# Patient Record
Sex: Female | Born: 1945 | Race: White | Hispanic: Yes | Marital: Married | Smoking: Light tobacco smoker
Health system: Southern US, Community
[De-identification: ages and names within clinical notes are randomized; demographics above are authoritative.]

## PROBLEM LIST (undated history)

## (undated) DIAGNOSIS — D229 Melanocytic nevi, unspecified: Secondary | ICD-10-CM

## (undated) DIAGNOSIS — D039 Melanoma in situ, unspecified: Secondary | ICD-10-CM

## (undated) DIAGNOSIS — I471 Supraventricular tachycardia, unspecified: Secondary | ICD-10-CM

## (undated) DIAGNOSIS — M1612 Unilateral primary osteoarthritis, left hip: Secondary | ICD-10-CM

## (undated) DIAGNOSIS — D099 Carcinoma in situ, unspecified: Secondary | ICD-10-CM

## (undated) HISTORY — PX: INCISION AND DRAINAGE BREAST ABSCESS: SUR672

## (undated) HISTORY — PX: TONSILLECTOMY: SUR1361

## (undated) HISTORY — PX: ABDOMINAL HYSTERECTOMY: SHX81

---
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
## 2014-03-21 DIAGNOSIS — D485 Neoplasm of uncertain behavior of skin: Secondary | ICD-10-CM | POA: Diagnosis not present

## 2014-03-21 DIAGNOSIS — L400 Psoriasis vulgaris: Secondary | ICD-10-CM | POA: Diagnosis not present

## 2014-03-21 DIAGNOSIS — D225 Melanocytic nevi of trunk: Secondary | ICD-10-CM | POA: Diagnosis not present

## 2014-03-21 DIAGNOSIS — L820 Inflamed seborrheic keratosis: Secondary | ICD-10-CM | POA: Diagnosis not present

## 2014-03-21 DIAGNOSIS — Z85820 Personal history of malignant melanoma of skin: Secondary | ICD-10-CM | POA: Diagnosis not present

## 2014-03-21 DIAGNOSIS — L409 Psoriasis, unspecified: Secondary | ICD-10-CM | POA: Diagnosis not present

## 2014-04-19 DIAGNOSIS — D485 Neoplasm of uncertain behavior of skin: Secondary | ICD-10-CM | POA: Diagnosis not present

## 2014-04-19 DIAGNOSIS — L820 Inflamed seborrheic keratosis: Secondary | ICD-10-CM | POA: Diagnosis not present

## 2014-04-19 DIAGNOSIS — D229 Melanocytic nevi, unspecified: Secondary | ICD-10-CM

## 2014-04-19 DIAGNOSIS — D099 Carcinoma in situ, unspecified: Secondary | ICD-10-CM

## 2014-04-19 DIAGNOSIS — L905 Scar conditions and fibrosis of skin: Secondary | ICD-10-CM | POA: Diagnosis not present

## 2014-04-19 DIAGNOSIS — D225 Melanocytic nevi of trunk: Secondary | ICD-10-CM | POA: Diagnosis not present

## 2014-04-19 DIAGNOSIS — L409 Psoriasis, unspecified: Secondary | ICD-10-CM | POA: Diagnosis not present

## 2014-04-19 DIAGNOSIS — C44721 Squamous cell carcinoma of skin of unspecified lower limb, including hip: Secondary | ICD-10-CM | POA: Diagnosis not present

## 2014-04-19 HISTORY — DX: Carcinoma in situ, unspecified: D09.9

## 2014-04-19 HISTORY — DX: Melanocytic nevi, unspecified: D22.9

## 2014-05-30 DIAGNOSIS — L570 Actinic keratosis: Secondary | ICD-10-CM | POA: Diagnosis not present

## 2014-05-30 DIAGNOSIS — D485 Neoplasm of uncertain behavior of skin: Secondary | ICD-10-CM | POA: Diagnosis not present

## 2014-05-30 DIAGNOSIS — L409 Psoriasis, unspecified: Secondary | ICD-10-CM | POA: Diagnosis not present

## 2014-05-30 DIAGNOSIS — C44721 Squamous cell carcinoma of skin of unspecified lower limb, including hip: Secondary | ICD-10-CM | POA: Diagnosis not present

## 2014-06-12 DIAGNOSIS — L400 Psoriasis vulgaris: Secondary | ICD-10-CM | POA: Diagnosis not present

## 2014-07-01 DIAGNOSIS — E784 Other hyperlipidemia: Secondary | ICD-10-CM | POA: Diagnosis not present

## 2014-07-01 DIAGNOSIS — I10 Essential (primary) hypertension: Secondary | ICD-10-CM | POA: Diagnosis not present

## 2014-07-04 DIAGNOSIS — I10 Essential (primary) hypertension: Secondary | ICD-10-CM | POA: Diagnosis not present

## 2014-07-04 DIAGNOSIS — E785 Hyperlipidemia, unspecified: Secondary | ICD-10-CM | POA: Diagnosis not present

## 2014-08-06 DIAGNOSIS — L409 Psoriasis, unspecified: Secondary | ICD-10-CM | POA: Diagnosis not present

## 2014-08-22 DIAGNOSIS — L720 Epidermal cyst: Secondary | ICD-10-CM | POA: Diagnosis not present

## 2014-08-23 DIAGNOSIS — L720 Epidermal cyst: Secondary | ICD-10-CM | POA: Diagnosis not present

## 2014-09-03 DIAGNOSIS — Z4802 Encounter for removal of sutures: Secondary | ICD-10-CM | POA: Diagnosis not present

## 2014-11-12 ENCOUNTER — Other Ambulatory Visit (HOSPITAL_COMMUNITY): Payer: Self-pay | Admitting: *Deleted

## 2014-11-13 ENCOUNTER — Encounter (HOSPITAL_COMMUNITY)
Admission: RE | Admit: 2014-11-13 | Discharge: 2014-11-13 | Disposition: A | Discharge status: Home / Self Care | Payer: Medicare Other | Source: Ambulatory Visit | Attending: Dermatology | Admitting: Dermatology

## 2014-11-13 DIAGNOSIS — L4050 Arthropathic psoriasis, unspecified: Secondary | ICD-10-CM | POA: Insufficient documentation

## 2014-11-13 DIAGNOSIS — L400 Psoriasis vulgaris: Secondary | ICD-10-CM | POA: Diagnosis not present

## 2014-11-13 MED ORDER — USTEKINUMAB 45 MG/0.5ML ~~LOC~~ SOSY
45.0000 mg | PREFILLED_SYRINGE | SUBCUTANEOUS | Status: DC
  Administered 2014-11-13: 45 mg via SUBCUTANEOUS
  Filled 2014-11-13: qty 0.5

## 2014-11-15 DIAGNOSIS — E785 Hyperlipidemia, unspecified: Secondary | ICD-10-CM | POA: Diagnosis not present

## 2014-11-15 DIAGNOSIS — I10 Essential (primary) hypertension: Secondary | ICD-10-CM | POA: Diagnosis not present

## 2014-11-19 DIAGNOSIS — E785 Hyperlipidemia, unspecified: Secondary | ICD-10-CM | POA: Diagnosis not present

## 2014-11-19 DIAGNOSIS — I10 Essential (primary) hypertension: Secondary | ICD-10-CM | POA: Diagnosis not present

## 2014-12-11 ENCOUNTER — Encounter (HOSPITAL_COMMUNITY)
Admission: RE | Admit: 2014-12-11 | Discharge: 2014-12-11 | Disposition: A | Discharge status: Home / Self Care | Payer: Medicare Other | Source: Ambulatory Visit | Attending: Dermatology | Admitting: Dermatology

## 2014-12-11 DIAGNOSIS — L4050 Arthropathic psoriasis, unspecified: Secondary | ICD-10-CM | POA: Insufficient documentation

## 2014-12-11 MED ORDER — USTEKINUMAB 45 MG/0.5ML ~~LOC~~ SOSY
45.0000 mg | PREFILLED_SYRINGE | Freq: Once | SUBCUTANEOUS | Status: AC
  Administered 2014-12-11: 45 mg via SUBCUTANEOUS
  Filled 2014-12-11: qty 0.5

## 2014-12-12 DIAGNOSIS — Z1231 Encounter for screening mammogram for malignant neoplasm of breast: Secondary | ICD-10-CM | POA: Diagnosis not present

## 2014-12-18 DIAGNOSIS — R922 Inconclusive mammogram: Secondary | ICD-10-CM | POA: Diagnosis not present

## 2014-12-26 DIAGNOSIS — R002 Palpitations: Secondary | ICD-10-CM | POA: Diagnosis not present

## 2014-12-31 DIAGNOSIS — Z79899 Other long term (current) drug therapy: Secondary | ICD-10-CM | POA: Diagnosis not present

## 2014-12-31 DIAGNOSIS — H25813 Combined forms of age-related cataract, bilateral: Secondary | ICD-10-CM | POA: Diagnosis not present

## 2014-12-31 DIAGNOSIS — H35712 Central serous chorioretinopathy, left eye: Secondary | ICD-10-CM | POA: Diagnosis not present

## 2015-02-13 DIAGNOSIS — H353221 Exudative age-related macular degeneration, left eye, with active choroidal neovascularization: Secondary | ICD-10-CM | POA: Diagnosis not present

## 2015-03-27 DIAGNOSIS — N63 Unspecified lump in breast: Secondary | ICD-10-CM | POA: Diagnosis not present

## 2015-04-03 DIAGNOSIS — H3554 Dystrophies primarily involving the retinal pigment epithelium: Secondary | ICD-10-CM | POA: Diagnosis not present

## 2015-04-03 DIAGNOSIS — H353222 Exudative age-related macular degeneration, left eye, with inactive choroidal neovascularization: Secondary | ICD-10-CM | POA: Diagnosis not present

## 2015-04-07 DIAGNOSIS — N6012 Diffuse cystic mastopathy of left breast: Secondary | ICD-10-CM | POA: Diagnosis not present

## 2015-04-07 DIAGNOSIS — R920 Mammographic microcalcification found on diagnostic imaging of breast: Secondary | ICD-10-CM | POA: Diagnosis not present

## 2015-04-07 DIAGNOSIS — D242 Benign neoplasm of left breast: Secondary | ICD-10-CM | POA: Diagnosis not present

## 2015-04-07 DIAGNOSIS — N6022 Fibroadenosis of left breast: Secondary | ICD-10-CM | POA: Diagnosis not present

## 2015-05-29 DIAGNOSIS — H3554 Dystrophies primarily involving the retinal pigment epithelium: Secondary | ICD-10-CM | POA: Diagnosis not present

## 2015-06-03 DIAGNOSIS — L409 Psoriasis, unspecified: Secondary | ICD-10-CM | POA: Diagnosis not present

## 2015-06-03 DIAGNOSIS — Z79899 Other long term (current) drug therapy: Secondary | ICD-10-CM | POA: Diagnosis not present

## 2015-07-10 ENCOUNTER — Other Ambulatory Visit (HOSPITAL_COMMUNITY): Payer: Self-pay | Admitting: *Deleted

## 2015-07-11 ENCOUNTER — Encounter (HOSPITAL_COMMUNITY)
Admission: RE | Admit: 2015-07-11 | Discharge: 2015-07-11 | Disposition: A | Discharge status: Home / Self Care | Payer: Medicare Other | Source: Ambulatory Visit | Attending: Dermatology | Admitting: Dermatology

## 2015-07-11 DIAGNOSIS — L4050 Arthropathic psoriasis, unspecified: Secondary | ICD-10-CM | POA: Insufficient documentation

## 2015-07-11 DIAGNOSIS — L400 Psoriasis vulgaris: Secondary | ICD-10-CM | POA: Insufficient documentation

## 2015-07-11 MED ORDER — USTEKINUMAB 45 MG/0.5ML ~~LOC~~ SOSY
45.0000 mg | PREFILLED_SYRINGE | Freq: Once | SUBCUTANEOUS | Status: AC
  Administered 2015-07-11: 45 mg via SUBCUTANEOUS
  Filled 2015-07-11: qty 0.5

## 2015-08-27 DIAGNOSIS — L400 Psoriasis vulgaris: Secondary | ICD-10-CM | POA: Diagnosis not present

## 2015-08-27 DIAGNOSIS — Z0000 Encounter for general adult medical examination without abnormal findings: Secondary | ICD-10-CM | POA: Diagnosis not present

## 2015-09-22 ENCOUNTER — Other Ambulatory Visit (HOSPITAL_COMMUNITY): Payer: Self-pay | Admitting: *Deleted

## 2015-09-23 ENCOUNTER — Ambulatory Visit (HOSPITAL_COMMUNITY)
Admission: RE | Admit: 2015-09-23 | Discharge: 2015-09-23 | Disposition: A | Discharge status: Home / Self Care | Payer: Medicare Other | Source: Ambulatory Visit | Attending: Physician Assistant | Admitting: Physician Assistant

## 2015-09-23 DIAGNOSIS — L4050 Arthropathic psoriasis, unspecified: Secondary | ICD-10-CM | POA: Diagnosis not present

## 2015-09-23 DIAGNOSIS — L400 Psoriasis vulgaris: Secondary | ICD-10-CM | POA: Diagnosis not present

## 2015-09-23 MED ORDER — USTEKINUMAB 45 MG/0.5ML ~~LOC~~ SOSY
45.0000 mg | PREFILLED_SYRINGE | Freq: Once | SUBCUTANEOUS | Status: AC
  Administered 2015-09-23: 45 mg via SUBCUTANEOUS
  Filled 2015-09-23: qty 0.5

## 2015-10-02 DIAGNOSIS — H3554 Dystrophies primarily involving the retinal pigment epithelium: Secondary | ICD-10-CM | POA: Diagnosis not present

## 2015-10-29 DIAGNOSIS — L989 Disorder of the skin and subcutaneous tissue, unspecified: Secondary | ICD-10-CM | POA: Diagnosis not present

## 2015-10-29 DIAGNOSIS — D225 Melanocytic nevi of trunk: Secondary | ICD-10-CM | POA: Diagnosis not present

## 2015-10-29 DIAGNOSIS — C4359 Malignant melanoma of other part of trunk: Secondary | ICD-10-CM | POA: Diagnosis not present

## 2015-10-29 DIAGNOSIS — L409 Psoriasis, unspecified: Secondary | ICD-10-CM | POA: Diagnosis not present

## 2015-10-29 DIAGNOSIS — D039 Melanoma in situ, unspecified: Secondary | ICD-10-CM

## 2015-10-29 HISTORY — DX: Melanoma in situ, unspecified: D03.9

## 2015-11-20 DIAGNOSIS — D225 Melanocytic nevi of trunk: Secondary | ICD-10-CM | POA: Diagnosis not present

## 2015-11-20 DIAGNOSIS — C4359 Malignant melanoma of other part of trunk: Secondary | ICD-10-CM | POA: Diagnosis not present

## 2015-12-01 DIAGNOSIS — Z4802 Encounter for removal of sutures: Secondary | ICD-10-CM | POA: Diagnosis not present

## 2015-12-02 DIAGNOSIS — Q828 Other specified congenital malformations of skin: Secondary | ICD-10-CM | POA: Diagnosis not present

## 2015-12-02 DIAGNOSIS — M24576 Contracture, unspecified foot: Secondary | ICD-10-CM | POA: Diagnosis not present

## 2015-12-02 DIAGNOSIS — M2010 Hallux valgus (acquired), unspecified foot: Secondary | ICD-10-CM | POA: Diagnosis not present

## 2015-12-02 DIAGNOSIS — M79672 Pain in left foot: Secondary | ICD-10-CM | POA: Diagnosis not present

## 2015-12-17 DIAGNOSIS — M2041 Other hammer toe(s) (acquired), right foot: Secondary | ICD-10-CM | POA: Diagnosis not present

## 2015-12-17 DIAGNOSIS — M2042 Other hammer toe(s) (acquired), left foot: Secondary | ICD-10-CM | POA: Diagnosis not present

## 2015-12-17 DIAGNOSIS — M79672 Pain in left foot: Secondary | ICD-10-CM | POA: Diagnosis not present

## 2015-12-17 DIAGNOSIS — B070 Plantar wart: Secondary | ICD-10-CM | POA: Diagnosis not present

## 2015-12-26 DIAGNOSIS — L570 Actinic keratosis: Secondary | ICD-10-CM | POA: Diagnosis not present

## 2015-12-26 DIAGNOSIS — D492 Neoplasm of unspecified behavior of bone, soft tissue, and skin: Secondary | ICD-10-CM | POA: Diagnosis not present

## 2015-12-26 DIAGNOSIS — L409 Psoriasis, unspecified: Secondary | ICD-10-CM | POA: Diagnosis not present

## 2015-12-26 DIAGNOSIS — L821 Other seborrheic keratosis: Secondary | ICD-10-CM | POA: Diagnosis not present

## 2015-12-26 DIAGNOSIS — L308 Other specified dermatitis: Secondary | ICD-10-CM | POA: Diagnosis not present

## 2016-01-08 ENCOUNTER — Other Ambulatory Visit (HOSPITAL_COMMUNITY): Payer: Self-pay | Admitting: *Deleted

## 2016-01-09 ENCOUNTER — Ambulatory Visit (HOSPITAL_COMMUNITY)
Admission: RE | Admit: 2016-01-09 | Discharge: 2016-01-09 | Disposition: A | Discharge status: Home / Self Care | Payer: Medicare Other | Source: Ambulatory Visit | Attending: Dermatology | Admitting: Dermatology

## 2016-01-09 DIAGNOSIS — L4050 Arthropathic psoriasis, unspecified: Secondary | ICD-10-CM | POA: Insufficient documentation

## 2016-01-09 MED ORDER — USTEKINUMAB 45 MG/0.5ML ~~LOC~~ SOSY
45.0000 mg | PREFILLED_SYRINGE | Freq: Once | SUBCUTANEOUS | Status: AC
  Administered 2016-01-09: 45 mg via SUBCUTANEOUS
  Filled 2016-01-09: qty 0.5

## 2016-01-13 DIAGNOSIS — Z1231 Encounter for screening mammogram for malignant neoplasm of breast: Secondary | ICD-10-CM | POA: Diagnosis not present

## 2016-01-19 DIAGNOSIS — Z1231 Encounter for screening mammogram for malignant neoplasm of breast: Secondary | ICD-10-CM | POA: Diagnosis not present

## 2016-01-19 DIAGNOSIS — Z9189 Other specified personal risk factors, not elsewhere classified: Secondary | ICD-10-CM | POA: Diagnosis not present

## 2016-02-19 DIAGNOSIS — L910 Hypertrophic scar: Secondary | ICD-10-CM | POA: Diagnosis not present

## 2016-02-19 DIAGNOSIS — L570 Actinic keratosis: Secondary | ICD-10-CM | POA: Diagnosis not present

## 2016-02-19 DIAGNOSIS — L409 Psoriasis, unspecified: Secondary | ICD-10-CM | POA: Diagnosis not present

## 2016-03-04 DIAGNOSIS — M79672 Pain in left foot: Secondary | ICD-10-CM | POA: Diagnosis not present

## 2016-03-04 DIAGNOSIS — M722 Plantar fascial fibromatosis: Secondary | ICD-10-CM | POA: Diagnosis not present

## 2016-03-19 ENCOUNTER — Other Ambulatory Visit (HOSPITAL_COMMUNITY): Payer: Self-pay | Admitting: *Deleted

## 2016-03-22 ENCOUNTER — Ambulatory Visit (HOSPITAL_COMMUNITY)
Admission: RE | Admit: 2016-03-22 | Discharge: 2016-03-22 | Disposition: A | Discharge status: Home / Self Care | Payer: Medicare Other | Source: Ambulatory Visit | Attending: Dermatology | Admitting: Dermatology

## 2016-03-22 DIAGNOSIS — L400 Psoriasis vulgaris: Secondary | ICD-10-CM | POA: Diagnosis not present

## 2016-03-22 MED ORDER — USTEKINUMAB 45 MG/0.5ML ~~LOC~~ SOSY
45.0000 mg | PREFILLED_SYRINGE | Freq: Once | SUBCUTANEOUS | Status: AC
  Administered 2016-03-22: 45 mg via SUBCUTANEOUS
  Filled 2016-03-22: qty 0.5

## 2016-03-24 DIAGNOSIS — H524 Presbyopia: Secondary | ICD-10-CM | POA: Diagnosis not present

## 2016-03-24 DIAGNOSIS — H35712 Central serous chorioretinopathy, left eye: Secondary | ICD-10-CM | POA: Diagnosis not present

## 2016-03-24 DIAGNOSIS — H25813 Combined forms of age-related cataract, bilateral: Secondary | ICD-10-CM | POA: Diagnosis not present

## 2016-04-14 DIAGNOSIS — M79672 Pain in left foot: Secondary | ICD-10-CM | POA: Diagnosis not present

## 2016-04-14 DIAGNOSIS — B070 Plantar wart: Secondary | ICD-10-CM | POA: Diagnosis not present

## 2016-04-21 DIAGNOSIS — L308 Other specified dermatitis: Secondary | ICD-10-CM | POA: Diagnosis not present

## 2016-04-21 DIAGNOSIS — L409 Psoriasis, unspecified: Secondary | ICD-10-CM | POA: Diagnosis not present

## 2016-04-21 DIAGNOSIS — D492 Neoplasm of unspecified behavior of bone, soft tissue, and skin: Secondary | ICD-10-CM | POA: Diagnosis not present

## 2016-04-21 DIAGNOSIS — D229 Melanocytic nevi, unspecified: Secondary | ICD-10-CM | POA: Diagnosis not present

## 2016-05-26 ENCOUNTER — Other Ambulatory Visit (HOSPITAL_COMMUNITY): Payer: Self-pay | Admitting: *Deleted

## 2016-05-26 DIAGNOSIS — B070 Plantar wart: Secondary | ICD-10-CM | POA: Diagnosis not present

## 2016-05-26 DIAGNOSIS — M79672 Pain in left foot: Secondary | ICD-10-CM | POA: Diagnosis not present

## 2016-05-27 ENCOUNTER — Encounter (HOSPITAL_COMMUNITY)
Admission: RE | Admit: 2016-05-27 | Discharge: 2016-05-27 | Disposition: A | Discharge status: Home / Self Care | Payer: Medicare Other | Source: Ambulatory Visit | Attending: Dermatology | Admitting: Dermatology

## 2016-05-27 DIAGNOSIS — L400 Psoriasis vulgaris: Secondary | ICD-10-CM | POA: Insufficient documentation

## 2016-05-27 MED ORDER — USTEKINUMAB 45 MG/0.5ML ~~LOC~~ SOSY
45.0000 mg | PREFILLED_SYRINGE | Freq: Once | SUBCUTANEOUS | Status: AC
  Administered 2016-05-27: 13:00:00 45 mg via SUBCUTANEOUS
  Filled 2016-05-27: qty 0.5

## 2016-07-06 DIAGNOSIS — B070 Plantar wart: Secondary | ICD-10-CM | POA: Diagnosis not present

## 2016-07-06 DIAGNOSIS — M79672 Pain in left foot: Secondary | ICD-10-CM | POA: Diagnosis not present

## 2016-07-09 DIAGNOSIS — D492 Neoplasm of unspecified behavior of bone, soft tissue, and skin: Secondary | ICD-10-CM | POA: Diagnosis not present

## 2016-07-09 DIAGNOSIS — L821 Other seborrheic keratosis: Secondary | ICD-10-CM | POA: Diagnosis not present

## 2016-07-09 DIAGNOSIS — L720 Epidermal cyst: Secondary | ICD-10-CM | POA: Diagnosis not present

## 2016-07-09 DIAGNOSIS — L570 Actinic keratosis: Secondary | ICD-10-CM | POA: Diagnosis not present

## 2016-07-09 DIAGNOSIS — L409 Psoriasis, unspecified: Secondary | ICD-10-CM | POA: Diagnosis not present

## 2016-07-29 DIAGNOSIS — Z719 Counseling, unspecified: Secondary | ICD-10-CM | POA: Diagnosis not present

## 2016-07-29 DIAGNOSIS — Z6831 Body mass index (BMI) 31.0-31.9, adult: Secondary | ICD-10-CM | POA: Diagnosis not present

## 2016-07-29 DIAGNOSIS — K068 Other specified disorders of gingiva and edentulous alveolar ridge: Secondary | ICD-10-CM | POA: Diagnosis not present

## 2016-07-29 DIAGNOSIS — Z139 Encounter for screening, unspecified: Secondary | ICD-10-CM | POA: Diagnosis not present

## 2016-07-29 DIAGNOSIS — F17200 Nicotine dependence, unspecified, uncomplicated: Secondary | ICD-10-CM | POA: Diagnosis not present

## 2016-07-30 DIAGNOSIS — M7740 Metatarsalgia, unspecified foot: Secondary | ICD-10-CM | POA: Diagnosis not present

## 2016-07-30 DIAGNOSIS — M79675 Pain in left toe(s): Secondary | ICD-10-CM | POA: Diagnosis not present

## 2016-07-30 DIAGNOSIS — L851 Acquired keratosis [keratoderma] palmaris et plantaris: Secondary | ICD-10-CM | POA: Diagnosis not present

## 2016-09-09 ENCOUNTER — Other Ambulatory Visit (HOSPITAL_COMMUNITY): Payer: Self-pay

## 2016-09-10 ENCOUNTER — Ambulatory Visit (HOSPITAL_COMMUNITY)
Admission: RE | Admit: 2016-09-10 | Discharge: 2016-09-10 | Disposition: A | Discharge status: Home / Self Care | Payer: Medicare Other | Source: Ambulatory Visit | Attending: Dermatology | Admitting: Dermatology

## 2016-09-10 DIAGNOSIS — L400 Psoriasis vulgaris: Secondary | ICD-10-CM | POA: Insufficient documentation

## 2016-09-10 MED ORDER — USTEKINUMAB 45 MG/0.5ML ~~LOC~~ SOSY
45.0000 mg | PREFILLED_SYRINGE | SUBCUTANEOUS | Status: DC
  Administered 2016-09-10: 12:00:00 45 mg via SUBCUTANEOUS
  Filled 2016-09-10: qty 0.5

## 2016-09-21 DIAGNOSIS — D492 Neoplasm of unspecified behavior of bone, soft tissue, and skin: Secondary | ICD-10-CM | POA: Diagnosis not present

## 2016-09-21 DIAGNOSIS — L409 Psoriasis, unspecified: Secondary | ICD-10-CM | POA: Diagnosis not present

## 2016-09-21 DIAGNOSIS — L430 Hypertrophic lichen planus: Secondary | ICD-10-CM | POA: Diagnosis not present

## 2016-09-21 DIAGNOSIS — L820 Inflamed seborrheic keratosis: Secondary | ICD-10-CM | POA: Diagnosis not present

## 2016-09-22 DIAGNOSIS — Z5181 Encounter for therapeutic drug level monitoring: Secondary | ICD-10-CM | POA: Diagnosis not present

## 2016-09-22 DIAGNOSIS — L400 Psoriasis vulgaris: Secondary | ICD-10-CM | POA: Diagnosis not present

## 2016-11-10 DIAGNOSIS — I10 Essential (primary) hypertension: Secondary | ICD-10-CM | POA: Diagnosis not present

## 2016-11-10 DIAGNOSIS — E782 Mixed hyperlipidemia: Secondary | ICD-10-CM | POA: Diagnosis not present

## 2016-11-10 DIAGNOSIS — Z8679 Personal history of other diseases of the circulatory system: Secondary | ICD-10-CM | POA: Diagnosis not present

## 2016-11-10 DIAGNOSIS — E785 Hyperlipidemia, unspecified: Secondary | ICD-10-CM | POA: Diagnosis not present

## 2016-11-26 ENCOUNTER — Other Ambulatory Visit (HOSPITAL_COMMUNITY): Payer: Self-pay

## 2016-11-29 ENCOUNTER — Ambulatory Visit (HOSPITAL_COMMUNITY)
Admission: RE | Admit: 2016-11-29 | Discharge: 2016-11-29 | Disposition: A | Discharge status: Home / Self Care | Payer: Medicare Other | Source: Ambulatory Visit | Attending: Dermatology | Admitting: Dermatology

## 2016-11-29 DIAGNOSIS — L409 Psoriasis, unspecified: Secondary | ICD-10-CM | POA: Insufficient documentation

## 2016-11-29 MED ORDER — USTEKINUMAB 45 MG/0.5ML ~~LOC~~ SOSY
45.0000 mg | PREFILLED_SYRINGE | Freq: Once | SUBCUTANEOUS | Status: AC
  Administered 2016-11-29: 45 mg via SUBCUTANEOUS
  Filled 2016-11-29: qty 0.5

## 2017-01-13 DIAGNOSIS — Z1231 Encounter for screening mammogram for malignant neoplasm of breast: Secondary | ICD-10-CM | POA: Diagnosis not present

## 2017-02-22 ENCOUNTER — Other Ambulatory Visit (HOSPITAL_COMMUNITY): Payer: Self-pay | Admitting: *Deleted

## 2017-02-23 ENCOUNTER — Encounter (HOSPITAL_COMMUNITY)
Admission: RE | Admit: 2017-02-23 | Discharge: 2017-02-23 | Disposition: A | Discharge status: Home / Self Care | Payer: Medicare Other | Source: Ambulatory Visit | Attending: Dermatology | Admitting: Dermatology

## 2017-02-23 DIAGNOSIS — L400 Psoriasis vulgaris: Secondary | ICD-10-CM | POA: Diagnosis not present

## 2017-02-23 MED ORDER — USTEKINUMAB 45 MG/0.5ML ~~LOC~~ SOSY
45.0000 mg | PREFILLED_SYRINGE | Freq: Once | SUBCUTANEOUS | Status: AC
  Administered 2017-02-23: 45 mg via SUBCUTANEOUS
  Filled 2017-02-23: qty 0.5

## 2017-03-01 DIAGNOSIS — S838X2A Sprain of other specified parts of left knee, initial encounter: Secondary | ICD-10-CM | POA: Diagnosis not present

## 2017-03-24 DIAGNOSIS — W260XXA Contact with knife, initial encounter: Secondary | ICD-10-CM | POA: Diagnosis not present

## 2017-03-24 DIAGNOSIS — S61217A Laceration without foreign body of left little finger without damage to nail, initial encounter: Secondary | ICD-10-CM | POA: Diagnosis not present

## 2017-03-24 DIAGNOSIS — Z886 Allergy status to analgesic agent status: Secondary | ICD-10-CM | POA: Diagnosis not present

## 2017-03-24 DIAGNOSIS — S01112A Laceration without foreign body of left eyelid and periocular area, initial encounter: Secondary | ICD-10-CM | POA: Diagnosis not present

## 2017-03-31 DIAGNOSIS — Z886 Allergy status to analgesic agent status: Secondary | ICD-10-CM | POA: Diagnosis not present

## 2017-03-31 DIAGNOSIS — L03012 Cellulitis of left finger: Secondary | ICD-10-CM | POA: Diagnosis not present

## 2017-03-31 DIAGNOSIS — L03112 Cellulitis of left axilla: Secondary | ICD-10-CM | POA: Diagnosis not present

## 2017-03-31 DIAGNOSIS — Z4802 Encounter for removal of sutures: Secondary | ICD-10-CM | POA: Diagnosis not present

## 2017-04-02 DIAGNOSIS — W260XXD Contact with knife, subsequent encounter: Secondary | ICD-10-CM | POA: Diagnosis not present

## 2017-04-02 DIAGNOSIS — J449 Chronic obstructive pulmonary disease, unspecified: Secondary | ICD-10-CM | POA: Diagnosis not present

## 2017-04-02 DIAGNOSIS — Z4802 Encounter for removal of sutures: Secondary | ICD-10-CM | POA: Diagnosis not present

## 2017-04-02 DIAGNOSIS — I10 Essential (primary) hypertension: Secondary | ICD-10-CM | POA: Diagnosis not present

## 2017-04-02 DIAGNOSIS — Z720 Tobacco use: Secondary | ICD-10-CM | POA: Diagnosis not present

## 2017-04-02 DIAGNOSIS — S61217D Laceration without foreign body of left little finger without damage to nail, subsequent encounter: Secondary | ICD-10-CM | POA: Diagnosis not present

## 2017-04-02 DIAGNOSIS — Z886 Allergy status to analgesic agent status: Secondary | ICD-10-CM | POA: Diagnosis not present

## 2017-05-20 DIAGNOSIS — I10 Essential (primary) hypertension: Secondary | ICD-10-CM | POA: Diagnosis not present

## 2017-05-20 DIAGNOSIS — E782 Mixed hyperlipidemia: Secondary | ICD-10-CM | POA: Diagnosis not present

## 2017-05-24 DIAGNOSIS — E782 Mixed hyperlipidemia: Secondary | ICD-10-CM | POA: Diagnosis not present

## 2017-05-24 DIAGNOSIS — I10 Essential (primary) hypertension: Secondary | ICD-10-CM | POA: Diagnosis not present

## 2017-06-16 DIAGNOSIS — L820 Inflamed seborrheic keratosis: Secondary | ICD-10-CM | POA: Diagnosis not present

## 2017-06-16 DIAGNOSIS — D485 Neoplasm of uncertain behavior of skin: Secondary | ICD-10-CM | POA: Diagnosis not present

## 2017-06-16 DIAGNOSIS — L409 Psoriasis, unspecified: Secondary | ICD-10-CM | POA: Diagnosis not present

## 2017-06-16 DIAGNOSIS — L720 Epidermal cyst: Secondary | ICD-10-CM | POA: Diagnosis not present

## 2017-06-30 DIAGNOSIS — H35712 Central serous chorioretinopathy, left eye: Secondary | ICD-10-CM | POA: Diagnosis not present

## 2017-06-30 DIAGNOSIS — H25813 Combined forms of age-related cataract, bilateral: Secondary | ICD-10-CM | POA: Diagnosis not present

## 2017-07-25 ENCOUNTER — Other Ambulatory Visit (HOSPITAL_COMMUNITY): Payer: Self-pay

## 2017-07-26 ENCOUNTER — Inpatient Hospital Stay (HOSPITAL_COMMUNITY): Admission: RE | Admit: 2017-07-26 | Payer: Medicare Other | Source: Ambulatory Visit

## 2017-08-01 ENCOUNTER — Ambulatory Visit (HOSPITAL_COMMUNITY)
Admission: RE | Admit: 2017-08-01 | Discharge: 2017-08-01 | Disposition: A | Discharge status: Home / Self Care | Payer: Medicare Other | Source: Ambulatory Visit | Attending: Dermatology | Admitting: Dermatology

## 2017-08-01 DIAGNOSIS — L400 Psoriasis vulgaris: Secondary | ICD-10-CM | POA: Diagnosis not present

## 2017-08-01 MED ORDER — USTEKINUMAB 45 MG/0.5ML ~~LOC~~ SOSY
45.0000 mg | PREFILLED_SYRINGE | Freq: Once | SUBCUTANEOUS | Status: AC
  Administered 2017-08-01: 45 mg via SUBCUTANEOUS
  Filled 2017-08-01: qty 0.5

## 2017-10-06 DIAGNOSIS — B070 Plantar wart: Secondary | ICD-10-CM | POA: Diagnosis not present

## 2017-10-18 ENCOUNTER — Encounter (HOSPITAL_COMMUNITY): Payer: Medicare Other

## 2017-10-21 ENCOUNTER — Other Ambulatory Visit (HOSPITAL_COMMUNITY): Payer: Self-pay | Admitting: *Deleted

## 2017-10-24 ENCOUNTER — Encounter (HOSPITAL_COMMUNITY)
Admission: RE | Admit: 2017-10-24 | Discharge: 2017-10-24 | Disposition: A | Discharge status: Home / Self Care | Payer: Medicare Other | Source: Ambulatory Visit | Attending: Dermatology | Admitting: Dermatology

## 2017-10-24 DIAGNOSIS — L409 Psoriasis, unspecified: Secondary | ICD-10-CM | POA: Insufficient documentation

## 2017-10-24 MED ORDER — USTEKINUMAB 45 MG/0.5ML ~~LOC~~ SOSY
45.0000 mg | PREFILLED_SYRINGE | Freq: Once | SUBCUTANEOUS | Status: AC
  Administered 2017-10-24: 45 mg via SUBCUTANEOUS
  Filled 2017-10-24: qty 0.5

## 2017-12-23 DIAGNOSIS — E782 Mixed hyperlipidemia: Secondary | ICD-10-CM | POA: Diagnosis not present

## 2017-12-23 DIAGNOSIS — I10 Essential (primary) hypertension: Secondary | ICD-10-CM | POA: Diagnosis not present

## 2017-12-27 DIAGNOSIS — I10 Essential (primary) hypertension: Secondary | ICD-10-CM | POA: Diagnosis not present

## 2017-12-27 DIAGNOSIS — E782 Mixed hyperlipidemia: Secondary | ICD-10-CM | POA: Diagnosis not present

## 2018-01-13 DIAGNOSIS — L400 Psoriasis vulgaris: Secondary | ICD-10-CM | POA: Diagnosis not present

## 2018-01-13 DIAGNOSIS — Z1231 Encounter for screening mammogram for malignant neoplasm of breast: Secondary | ICD-10-CM | POA: Diagnosis not present

## 2018-02-03 ENCOUNTER — Other Ambulatory Visit (HOSPITAL_COMMUNITY): Payer: Self-pay | Admitting: *Deleted

## 2018-02-06 ENCOUNTER — Ambulatory Visit (HOSPITAL_COMMUNITY)
Admission: RE | Admit: 2018-02-06 | Discharge: 2018-02-06 | Disposition: A | Discharge status: Home / Self Care | Payer: Medicare Other | Source: Ambulatory Visit | Attending: Dermatology | Admitting: Dermatology

## 2018-02-06 DIAGNOSIS — L409 Psoriasis, unspecified: Secondary | ICD-10-CM | POA: Diagnosis not present

## 2018-02-06 MED ORDER — USTEKINUMAB 45 MG/0.5ML ~~LOC~~ SOSY
45.0000 mg | PREFILLED_SYRINGE | SUBCUTANEOUS | Status: DC
  Administered 2018-02-06: 45 mg via SUBCUTANEOUS
  Filled 2018-02-06: qty 0.5

## 2018-02-23 DIAGNOSIS — L820 Inflamed seborrheic keratosis: Secondary | ICD-10-CM | POA: Diagnosis not present

## 2018-02-23 DIAGNOSIS — D485 Neoplasm of uncertain behavior of skin: Secondary | ICD-10-CM | POA: Diagnosis not present

## 2018-02-23 DIAGNOSIS — L409 Psoriasis, unspecified: Secondary | ICD-10-CM | POA: Diagnosis not present

## 2018-02-23 DIAGNOSIS — D229 Melanocytic nevi, unspecified: Secondary | ICD-10-CM | POA: Diagnosis not present

## 2018-04-28 ENCOUNTER — Other Ambulatory Visit: Payer: Self-pay

## 2018-05-01 ENCOUNTER — Other Ambulatory Visit: Payer: Self-pay

## 2018-05-01 ENCOUNTER — Ambulatory Visit (HOSPITAL_COMMUNITY)
Admission: RE | Admit: 2018-05-01 | Discharge: 2018-05-01 | Disposition: A | Discharge status: Home / Self Care | Payer: Medicare Other | Source: Ambulatory Visit | Attending: Dermatology | Admitting: Dermatology

## 2018-05-01 DIAGNOSIS — L409 Psoriasis, unspecified: Secondary | ICD-10-CM | POA: Insufficient documentation

## 2018-05-01 MED ORDER — USTEKINUMAB 45 MG/0.5ML ~~LOC~~ SOSY
45.0000 mg | PREFILLED_SYRINGE | SUBCUTANEOUS | Status: DC
  Administered 2018-05-01: 45 mg via SUBCUTANEOUS
  Filled 2018-05-01: qty 0.5

## 2018-07-07 DIAGNOSIS — H35712 Central serous chorioretinopathy, left eye: Secondary | ICD-10-CM | POA: Diagnosis not present

## 2018-07-07 DIAGNOSIS — H2513 Age-related nuclear cataract, bilateral: Secondary | ICD-10-CM | POA: Diagnosis not present

## 2018-07-18 DIAGNOSIS — L409 Psoriasis, unspecified: Secondary | ICD-10-CM | POA: Diagnosis not present

## 2018-07-18 DIAGNOSIS — D229 Melanocytic nevi, unspecified: Secondary | ICD-10-CM | POA: Diagnosis not present

## 2018-07-21 ENCOUNTER — Other Ambulatory Visit (HOSPITAL_COMMUNITY): Payer: Self-pay | Admitting: *Deleted

## 2018-07-24 ENCOUNTER — Ambulatory Visit (HOSPITAL_COMMUNITY)
Admission: RE | Admit: 2018-07-24 | Discharge: 2018-07-24 | Disposition: A | Discharge status: Home / Self Care | Payer: Medicare Other | Source: Ambulatory Visit | Attending: Dermatology | Admitting: Dermatology

## 2018-07-24 ENCOUNTER — Other Ambulatory Visit: Payer: Self-pay

## 2018-07-24 DIAGNOSIS — L400 Psoriasis vulgaris: Secondary | ICD-10-CM | POA: Diagnosis not present

## 2018-07-24 MED ORDER — USTEKINUMAB 45 MG/0.5ML ~~LOC~~ SOSY
45.0000 mg | PREFILLED_SYRINGE | SUBCUTANEOUS | Status: DC
  Administered 2018-07-24: 45 mg via SUBCUTANEOUS
  Filled 2018-07-24: qty 0.5

## 2018-07-25 MED FILL — Ustekinumab Soln Prefilled Syringe 45 MG/0.5ML: SUBCUTANEOUS | Qty: 0.5 | Status: AC

## 2018-10-17 ENCOUNTER — Other Ambulatory Visit: Payer: Self-pay

## 2018-10-17 ENCOUNTER — Ambulatory Visit (HOSPITAL_COMMUNITY)
Admission: RE | Admit: 2018-10-17 | Discharge: 2018-10-17 | Disposition: A | Discharge status: Home / Self Care | Payer: Medicare Other | Source: Ambulatory Visit | Attending: Dermatology | Admitting: Dermatology

## 2018-10-17 DIAGNOSIS — L409 Psoriasis, unspecified: Secondary | ICD-10-CM | POA: Insufficient documentation

## 2018-10-17 MED ORDER — USTEKINUMAB 45 MG/0.5ML ~~LOC~~ SOSY
45.0000 mg | PREFILLED_SYRINGE | SUBCUTANEOUS | Status: DC
  Administered 2018-10-17: 45 mg via SUBCUTANEOUS
  Filled 2018-10-17: qty 0.5

## 2018-11-14 ENCOUNTER — Ambulatory Visit (INDEPENDENT_AMBULATORY_CARE_PROVIDER_SITE_OTHER): Payer: Medicare Other | Admitting: Orthopaedic Surgery

## 2018-11-14 ENCOUNTER — Ambulatory Visit: Payer: Self-pay

## 2018-11-14 ENCOUNTER — Encounter: Payer: Self-pay | Admitting: Orthopaedic Surgery

## 2018-11-14 DIAGNOSIS — G8929 Other chronic pain: Secondary | ICD-10-CM | POA: Diagnosis not present

## 2018-11-14 DIAGNOSIS — M25512 Pain in left shoulder: Secondary | ICD-10-CM

## 2018-11-14 MED ORDER — BUPIVACAINE HCL 0.25 % IJ SOLN
2.0000 mL | INTRAMUSCULAR | Status: AC | PRN
  Administered 2018-11-14: 14:00:00 2 mL via INTRA_ARTICULAR

## 2018-11-14 MED ORDER — METHYLPREDNISOLONE ACETATE 40 MG/ML IJ SUSP
40.0000 mg | INTRAMUSCULAR | Status: AC | PRN
  Administered 2018-11-14: 14:00:00 40 mg via INTRA_ARTICULAR

## 2018-11-14 MED ORDER — LIDOCAINE HCL 2 % IJ SOLN
2.0000 mL | INTRAMUSCULAR | Status: AC | PRN
  Administered 2018-11-14: 2 mL

## 2018-11-14 NOTE — Progress Notes (Signed)
Office Visit Note   Patient: Angel Mason           Date of Birth: 01-26-1946           MRN: [id number] Visit Date: 11/14/2018              Requested by: Tommie Sams, MD [address] [place] [address] PCP: Tommie Sams, MD   Assessment & Plan: Visit Diagnoses:  1. Chronic left shoulder pain     Plan: Impression is left shoulder subacromial bursitis and rotator cuff tendinitis.  We will inject this with cortisone today.  She will give this at least 2 to 3 weeks to notice improvement.  If she does not notice any improvement she will call us and let us know.  Otherwise, follow-up with [place] as needed.  Follow-Up Instructions: Return if symptoms worsen or fail to improve.   Orders:  Orders Placed This Encounter  Procedures  . Large Joint Inj: L subacromial bursa  . XR Shoulder Left   No orders of the defined types were placed in this encounter.     Procedures: Large Joint Inj: L subacromial bursa on 11/14/2018 2:01 PM Indications: pain Details: 22 G needle Medications: 2 mL bupivacaine 0.25 %; 2 mL lidocaine 2 %; 40 mg methylPREDNISolone acetate 40 MG/ML Outcome: tolerated well, no immediate complications Patient was prepped and draped in the usual sterile fashion.       Clinical Data: No additional findings.   Subjective: Chief Complaint  Patient presents with  . Left Shoulder - Pain    HPI patient is a pleasant right-hand-dominant female who presents our clinic today with left shoulder pain for the past 6 months with worsening symptoms over the past month.  She denies any injury or change in activity.  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  The pain she has is primarily to the shoulder joint itself and radiates into the deltoid.  She describes this as an ache worse while sleeping on the left side or internal rotation or abduction of the shoulder.  She denies any weakness.  She has been taking Advil with moderate relief of  symptoms.  She denies any numbness, tingling or burning.  No previous shoulder injection or surgical intervention.  Review of Systems as detailed in HPI.  All others reviewed and are negative.   Objective: Vital Signs: There were no vitals taken for this visit.  Physical Exam well-developed and well-nourished female in no acute distress.  Alert and oriented x3.  Ortho Exam examination of her left shoulder reveals full active range of motion with forward flexion, internal and external rotation although she has pain at the extremes.  She can internally rotate to her back pocket.  She is neurovascularly intact distally.  Specialty Comments:  No specialty comments available.  Imaging: Xr Shoulder Left  Result Date: 11/14/2018 Joint space narrowing to the [facility] and glenohumeral joints    PMFS History: There are no active problems to display for this patient.  History reviewed. No pertinent past medical history.  History reviewed. No pertinent family history.  History reviewed. No pertinent surgical history. Social History   Occupational History  . Not on file  Tobacco Use  . Smoking status: Never Smoker  . Smokeless tobacco: Never Used  Substance and Sexual Activity  . Alcohol use: Not on file  . Drug use: Not on file  . Sexual activity: Not on file

## 2018-12-06 ENCOUNTER — Encounter: Payer: Self-pay | Admitting: *Deleted

## 2018-12-12 ENCOUNTER — Other Ambulatory Visit: Payer: Self-pay

## 2018-12-12 ENCOUNTER — Ambulatory Visit (INDEPENDENT_AMBULATORY_CARE_PROVIDER_SITE_OTHER): Payer: Medicare Other | Admitting: Orthopaedic Surgery

## 2018-12-12 DIAGNOSIS — M25512 Pain in left shoulder: Secondary | ICD-10-CM | POA: Diagnosis not present

## 2018-12-12 DIAGNOSIS — G8929 Other chronic pain: Secondary | ICD-10-CM

## 2018-12-12 NOTE — Progress Notes (Signed)
   Office Visit Note   Patient: Angel Mason           Date of Birth: [birth year]           MRN: [id number] Visit Date: 12/12/2018              Requested by: Tommie Sams, MD [address] [place] [address] PCP: Tommie Sams, MD   Assessment & Plan: Visit Diagnoses:  1. Chronic left shoulder pain     Plan: My impression is rotator cuff tear of the left shoulder.  We will obtain MRI at this point to evaluate for this since she has failed conservative treatment.  Follow-up after the MRI.  Follow-Up Instructions: Return in about 10 days (around 12/22/2018) for review of left shoulder MRI.   Orders:  No orders of the defined types were placed in this encounter.  No orders of the defined types were placed in this encounter.     Procedures: No procedures performed   Clinical Data: No additional findings.   Subjective: Chief Complaint  Patient presents with  . Left Shoulder - Pain    Angel Mason returns today for continued left shoulder pain.  She states that the pain is worse with raising her arm and reaching back.  Is also worse with sleeping on the left side.  Occasionally she has some radiation of pain down into the hand.   Review of Systems   Objective: Vital Signs: There were no vitals taken for this visit.  Physical Exam  Ortho Exam Left shoulder exam shows positive empty can pain with infraspinatus and belly press and bearhug.  She has a slight shrug compensation with elevation of the arm. Specialty Comments:  No specialty comments available.  Imaging: No results found.   PMFS History: Patient Active Problem List   Diagnosis Date Noted  . Chronic left shoulder pain 12/12/2018   Past Medical History:  Diagnosis Date  . 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  No  past surgical history on file. Social History   Occupational History  . Not on file  Tobacco Use  . Smoking status: Never Smoker  . Smokeless tobacco: Never Used  Substance and Sexual Activity  . Alcohol use: Not on file  . Drug use: Not on file  . Sexual activity: Not on file

## 2018-12-12 NOTE — Addendum Note (Signed)
Addended by: Precious Bard on: 12/12/2018 02:15 PM   Modules accepted: Orders

## 2019-01-06 ENCOUNTER — Other Ambulatory Visit: Payer: Self-pay

## 2019-01-06 ENCOUNTER — Ambulatory Visit
Admission: RE | Admit: 2019-01-06 | Discharge: 2019-01-06 | Disposition: A | Discharge status: Home / Self Care | Payer: Medicare Other | Source: Ambulatory Visit | Attending: Orthopaedic Surgery | Admitting: Orthopaedic Surgery

## 2019-01-06 DIAGNOSIS — M19012 Primary osteoarthritis, left shoulder: Secondary | ICD-10-CM | POA: Diagnosis not present

## 2019-01-06 DIAGNOSIS — G8929 Other chronic pain: Secondary | ICD-10-CM

## 2019-01-06 DIAGNOSIS — M25512 Pain in left shoulder: Secondary | ICD-10-CM

## 2019-01-09 ENCOUNTER — Encounter: Payer: Self-pay | Admitting: Orthopaedic Surgery

## 2019-01-09 ENCOUNTER — Ambulatory Visit: Payer: Self-pay

## 2019-01-09 ENCOUNTER — Ambulatory Visit (INDEPENDENT_AMBULATORY_CARE_PROVIDER_SITE_OTHER): Payer: Medicare Other | Admitting: Orthopaedic Surgery

## 2019-01-09 ENCOUNTER — Other Ambulatory Visit: Payer: Self-pay

## 2019-01-09 ENCOUNTER — Ambulatory Visit (HOSPITAL_COMMUNITY)
Admission: RE | Admit: 2019-01-09 | Discharge: 2019-01-09 | Disposition: A | Discharge status: Home / Self Care | Payer: Medicare Other | Source: Ambulatory Visit | Attending: Dermatology | Admitting: Dermatology

## 2019-01-09 DIAGNOSIS — G8929 Other chronic pain: Secondary | ICD-10-CM | POA: Diagnosis not present

## 2019-01-09 DIAGNOSIS — M25512 Pain in left shoulder: Secondary | ICD-10-CM | POA: Diagnosis not present

## 2019-01-09 DIAGNOSIS — L409 Psoriasis, unspecified: Secondary | ICD-10-CM | POA: Diagnosis not present

## 2019-01-09 MED ORDER — USTEKINUMAB 45 MG/0.5ML ~~LOC~~ SOSY
45.0000 mg | PREFILLED_SYRINGE | SUBCUTANEOUS | Status: DC
  Administered 2019-01-09: 45 mg via SUBCUTANEOUS
  Filled 2019-01-09: qty 0.5

## 2019-01-09 NOTE — Progress Notes (Signed)
Subjective: Patient is here for ultrasound-guided intra-articular left glenohumeral injection.    Objective:  Pain with overhead reach.  Procedure: Ultrasound-guided left glenohumeral injection: After sterile prep with Betadine, injected 8 cc 1% lidocaine without epinephrine and 40 mg methylprednisolone using a 22-gauge spinal needle, passing the needle through approach into the glenohumeral joint.  Injectate seen filling joint capsule.  Very good immediate relief.

## 2019-01-09 NOTE — Addendum Note (Signed)
Addended by: Marlyne Beards on: 01/09/2019 01:57 PM   Modules accepted: Orders

## 2019-01-09 NOTE — Progress Notes (Signed)
   Office Visit Note   Patient: Angel Mason           Date of Birth: 02-28-1945           MRN: [id number] Visit Date: 01/09/2019              Requested by: Tommie Sams, MD [address] [place] [address] PCP: Tommie Sams, MD   Assessment & Plan: Visit Diagnoses:  1. Chronic left shoulder pain     Plan: MRI of the left shoulder shows tendinopathy of the biceps tendon and supraspinatus.  The tendinopathy of the supraspinatus appears to be worse in the articular side.  Based on discussion we will try an intra-articular steroid injection and physical therapy.  I would like to recheck her in 6 weeks.  Follow-Up Instructions: Return in about 6 weeks (around 02/20/2019).   Orders:  No orders of the defined types were placed in this encounter.  No orders of the defined types were placed in this encounter.     Procedures: No procedures performed   Clinical Data: No additional findings.   Subjective: Chief Complaint  Patient presents with  . Left Shoulder - Follow-up    Angel Mason returns today for MRI review of her left shoulder.  She states that her shoulder is not any better.   Review of Systems  Constitutional: Negative.   HENT: Negative.   Eyes: Negative.   Respiratory: Negative.   Cardiovascular: Negative.   Endocrine: Negative.   Musculoskeletal: Negative.   Neurological: Negative.   Hematological: Negative.   Psychiatric/Behavioral: Negative.   All other systems reviewed and are negative.    Objective: Vital Signs: There were no vitals taken for this visit.  Physical Exam Vitals signs and nursing note reviewed.  Constitutional:      Appearance: She is well-developed.  Pulmonary:     Effort: Pulmonary effort is normal.  Skin:    General: Skin is warm.     Capillary Refill: Capillary refill takes less than 2 seconds.  Neurological:     Mental Status: She is alert and oriented to person, place, and time.  Psychiatric:        Behavior:  Behavior normal.        Thought Content: Thought content normal.        Judgment: Judgment normal.     Ortho Exam Left shoulder exam shows pain with elevation of the arm and empty can testing.  She does not have a strong compensation. Specialty Comments:  No specialty comments available.  Imaging: No results found.   PMFS History: Patient Active Problem List   Diagnosis Date Noted  . Chronic left shoulder pain 12/12/2018   Past Medical History:  Diagnosis Date  . 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 No pertinent family history.  History reviewed. No pertinent surgical history. Social History   Occupational History  . Not on file  Tobacco Use  . Smoking status: Never Smoker  . Smokeless tobacco: Never Used  Substance and Sexual Activity  . Alcohol use: Not on file  . Drug use: Not on file  . Sexual activity: Not on file

## 2019-01-15 DIAGNOSIS — Z1231 Encounter for screening mammogram for malignant neoplasm of breast: Secondary | ICD-10-CM | POA: Diagnosis not present

## 2019-02-13 DIAGNOSIS — M25512 Pain in left shoulder: Secondary | ICD-10-CM | POA: Diagnosis not present

## 2019-02-20 DIAGNOSIS — M25512 Pain in left shoulder: Secondary | ICD-10-CM | POA: Diagnosis not present

## 2019-02-22 DIAGNOSIS — M25512 Pain in left shoulder: Secondary | ICD-10-CM | POA: Diagnosis not present

## 2019-02-27 DIAGNOSIS — M25512 Pain in left shoulder: Secondary | ICD-10-CM | POA: Diagnosis not present

## 2019-03-01 DIAGNOSIS — M25512 Pain in left shoulder: Secondary | ICD-10-CM | POA: Diagnosis not present

## 2019-03-06 DIAGNOSIS — M25512 Pain in left shoulder: Secondary | ICD-10-CM | POA: Diagnosis not present

## 2019-03-07 ENCOUNTER — Encounter: Payer: Self-pay | Admitting: Orthopaedic Surgery

## 2019-03-07 ENCOUNTER — Ambulatory Visit (INDEPENDENT_AMBULATORY_CARE_PROVIDER_SITE_OTHER): Payer: Medicare Other | Admitting: Orthopaedic Surgery

## 2019-03-07 ENCOUNTER — Other Ambulatory Visit: Payer: Self-pay

## 2019-03-07 DIAGNOSIS — M25512 Pain in left shoulder: Secondary | ICD-10-CM | POA: Diagnosis not present

## 2019-03-07 DIAGNOSIS — G8929 Other chronic pain: Secondary | ICD-10-CM

## 2019-03-07 NOTE — Progress Notes (Signed)
   Office Visit Note   Patient: Angel Mason           Date of Birth: 1945-03-11           MRN: [id number] Visit Date: 03/07/2019              Requested by: Tommie Sams, MD [address] [place] [address] PCP: Tommie Sams, MD   Assessment & Plan: Visit Diagnoses:  1. Chronic left shoulder pain     Plan: Impression is improving left shoulder pain.  Previous MRI showed tendinopathy of the rotator cuff and biceps tendon.  She has done well from the injection and physical therapy.  She will continue with physical therapy.  We will see her back as needed.  Follow-Up Instructions: Return if symptoms worsen or fail to improve.   Orders:  No orders of the defined types were placed in this encounter.  No orders of the defined types were placed in this encounter.     Procedures: No procedures performed   Clinical Data: No additional findings.   Subjective: Chief Complaint  Patient presents with  . Left Shoulder - Pain    Angel Mason returns today for follow-up of her left shoulder pain.  She is doing much better.  The pain is no longer waking up at night.  The injection helped.  She is doing physical therapy twice a week.  Overall she is happy with her improvement.   Review of Systems   Objective: Vital Signs: There were no vitals taken for this visit.  Physical Exam  Ortho Exam Left shoulder exam shows full range of motion.  Slight discomfort with empty can and Speed test.  Good strength overall. Specialty Comments:  No specialty comments available.  Imaging: No results found.   PMFS History: Patient Active Problem List   Diagnosis Date Noted  . Chronic left shoulder pain 12/12/2018   Past Medical History:  Diagnosis Date  . 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 No pertinent  family history.  History reviewed. No pertinent surgical history. Social History   Occupational History  . Not on file  Tobacco Use  . Smoking status: Never Smoker  . Smokeless tobacco: Never Used  Substance and Sexual Activity  . Alcohol use: Not on file  . Drug use: Not on file  . Sexual activity: Not on file

## 2019-03-08 DIAGNOSIS — Z409 Encounter for prophylactic surgery, unspecified: Secondary | ICD-10-CM | POA: Diagnosis not present

## 2019-03-08 DIAGNOSIS — R5383 Other fatigue: Secondary | ICD-10-CM | POA: Diagnosis not present

## 2019-03-08 DIAGNOSIS — Z5181 Encounter for therapeutic drug level monitoring: Secondary | ICD-10-CM | POA: Diagnosis not present

## 2019-03-09 DIAGNOSIS — M25512 Pain in left shoulder: Secondary | ICD-10-CM | POA: Diagnosis not present

## 2019-03-13 DIAGNOSIS — M25512 Pain in left shoulder: Secondary | ICD-10-CM | POA: Diagnosis not present

## 2019-03-15 DIAGNOSIS — M25512 Pain in left shoulder: Secondary | ICD-10-CM | POA: Diagnosis not present

## 2019-03-20 DIAGNOSIS — M25512 Pain in left shoulder: Secondary | ICD-10-CM | POA: Diagnosis not present

## 2019-03-22 DIAGNOSIS — M25512 Pain in left shoulder: Secondary | ICD-10-CM | POA: Diagnosis not present

## 2019-04-03 ENCOUNTER — Other Ambulatory Visit: Payer: Self-pay

## 2019-04-03 ENCOUNTER — Ambulatory Visit (HOSPITAL_COMMUNITY)
Admission: RE | Admit: 2019-04-03 | Discharge: 2019-04-03 | Disposition: A | Discharge status: Home / Self Care | Payer: Medicare Other | Source: Ambulatory Visit | Attending: Dermatology | Admitting: Dermatology

## 2019-04-03 DIAGNOSIS — L409 Psoriasis, unspecified: Secondary | ICD-10-CM | POA: Diagnosis not present

## 2019-04-03 MED ORDER — USTEKINUMAB 45 MG/0.5ML ~~LOC~~ SOSY
45.0000 mg | PREFILLED_SYRINGE | SUBCUTANEOUS | Status: DC
  Administered 2019-04-03: 45 mg via SUBCUTANEOUS
  Filled 2019-04-03: qty 0.5

## 2019-04-11 DIAGNOSIS — Z23 Encounter for immunization: Secondary | ICD-10-CM | POA: Diagnosis not present

## 2019-05-10 DIAGNOSIS — Z23 Encounter for immunization: Secondary | ICD-10-CM | POA: Diagnosis not present

## 2019-05-16 ENCOUNTER — Ambulatory Visit (INDEPENDENT_AMBULATORY_CARE_PROVIDER_SITE_OTHER): Payer: Medicare Other | Admitting: Physician Assistant

## 2019-05-16 ENCOUNTER — Other Ambulatory Visit: Payer: Self-pay

## 2019-05-16 ENCOUNTER — Encounter: Payer: Self-pay | Admitting: Physician Assistant

## 2019-05-16 DIAGNOSIS — C44722 Squamous cell carcinoma of skin of right lower limb, including hip: Secondary | ICD-10-CM | POA: Diagnosis not present

## 2019-05-16 DIAGNOSIS — L858 Other specified epidermal thickening: Secondary | ICD-10-CM

## 2019-05-16 NOTE — Patient Instructions (Signed)

## 2019-05-16 NOTE — Progress Notes (Deleted)
   Follow-Up Visit   Subjective  Angel Mason is a 74 y.o. female who presents for the following: Skin Problem (right outer leg x 3 mths -itches and drives me crazy).   Location:  R shin Duration: 3 month Quality: volcano growth Associated Signs/Symptoms: sort Modifying Factors:  Persistent growing    The following portions of the chart were reviewed this encounter and updated as appropriate: Tobacco  Allergies  Meds  Problems  Med Hx  Surg Hx  Fam Hx      Objective  Well appearing patient in no apparent distress; mood and affect are within normal limits.  A full examination was performed including scalp, head, eyes, ears, nose, lips, neck, chest, axillae, abdomen, back, buttocks, bilateral upper extremities, bilateral lower extremities, hands, feet, fingers, toes, fingernails, and toenails. All findings within normal limits unless otherwise noted below. No atypical nevi noted at the time of the visit.  Objective  Right Lower Leg - Anterior: Volcano growth on pink base  Tx p bx-curet & cautery 1.1cm      Assessment & Plan  Neoplasm of skin Right Lower Leg - Anterior  Epidermal / dermal shaving  Lesion length (cm):  1.1 Lesion width (cm):  1.1 Margin per side (cm):  0.2 Total excision diameter (cm):  1.5 Informed consent: discussed and consent obtained   Timeout: patient name, date of birth, surgical site, and procedure verified   Procedure prep:  Patient was prepped and draped in usual sterile fashion Prep type:  Chlorhexidine Anesthesia: the lesion was anesthetized in a standard fashion   Anesthetic:  1% lidocaine w/ epinephrine 1-100,000 local infiltration Instrument used: flexible razor blade   Outcome: patient tolerated procedure well   Post-procedure details: wound care instructions given   Additional details:  ED &C  Specimen 1 - Surgical pathology Differential Diagnosis: KA Check Margins: No Volcano growth on pink base

## 2019-05-17 ENCOUNTER — Ambulatory Visit: Payer: Medicare Other | Admitting: Physician Assistant

## 2019-05-21 ENCOUNTER — Telehealth: Payer: Self-pay

## 2019-05-21 NOTE — Telephone Encounter (Signed)
Phone call to patient with her Pathology results and [facility] [place]'s recommendations.  Patient aware of pathology results and [facility] recommendations.

## 2019-05-21 NOTE — Telephone Encounter (Signed)
-----   Message from Warren Danes, [place] sent at 05/18/2019  3:38 PM EDT ----- Recheck 6 mo

## 2019-06-14 DIAGNOSIS — I471 Supraventricular tachycardia: Secondary | ICD-10-CM | POA: Diagnosis not present

## 2019-06-14 DIAGNOSIS — R609 Edema, unspecified: Secondary | ICD-10-CM | POA: Diagnosis not present

## 2019-06-14 DIAGNOSIS — Z136 Encounter for screening for cardiovascular disorders: Secondary | ICD-10-CM | POA: Diagnosis not present

## 2019-06-26 ENCOUNTER — Other Ambulatory Visit: Payer: Self-pay

## 2019-06-26 ENCOUNTER — Ambulatory Visit (HOSPITAL_COMMUNITY)
Admission: RE | Admit: 2019-06-26 | Discharge: 2019-06-26 | Disposition: A | Discharge status: Home / Self Care | Payer: Medicare Other | Source: Ambulatory Visit | Attending: Dermatology | Admitting: Dermatology

## 2019-06-26 DIAGNOSIS — L409 Psoriasis, unspecified: Secondary | ICD-10-CM | POA: Diagnosis not present

## 2019-06-26 MED ORDER — USTEKINUMAB 45 MG/0.5ML ~~LOC~~ SOSY
45.0000 mg | PREFILLED_SYRINGE | SUBCUTANEOUS | Status: DC
  Filled 2019-06-26: qty 0.5

## 2019-06-26 MED ORDER — USTEKINUMAB 45 MG/0.5ML ~~LOC~~ SOSY
45.0000 mg | PREFILLED_SYRINGE | SUBCUTANEOUS | Status: DC
  Administered 2019-06-26: 45 mg via SUBCUTANEOUS
  Filled 2019-06-26: qty 0.5

## 2019-06-27 ENCOUNTER — Telehealth: Payer: Self-pay | Admitting: *Deleted

## 2019-06-27 NOTE — Telephone Encounter (Signed)
Stelara order faxed to [facility] fax # [phone]

## 2019-08-29 NOTE — Addendum Note (Signed)
Addended by: Robyne Askew R on: 08/29/2019 10:01 AM   Modules accepted: Level of Service

## 2019-08-29 NOTE — Progress Notes (Signed)
   Follow-Up Visit   Subjective  Angel Mason is a 74 y.o. female who presents for the following: Skin Problem (right outer leg x 3 mths -itches and drives me crazy).   The following portions of the chart were reviewed this encounter and updated as appropriate: Tobacco  Allergies  Meds  Problems  Med Hx  Surg Hx  Fam Hx      Objective  Well appearing patient in no apparent distress; mood and affect are within normal limits.  A full examination was performed including scalp, head, eyes, ears, nose, lips, neck, chest, axillae, abdomen, back, buttocks, bilateral upper extremities, bilateral lower extremities, hands, feet, fingers, toes, fingernails, and toenails. All findings within normal limits unless otherwise noted below.  Objective  Volcano growth  Right Lower Leg - Anterior: Volcano growth on pink base  Tx p bx-curet & cautery 1.1cm  Images     Assessment & Plan  Keratoacanthoma Right Lower Leg - Anterior  Epidermal / dermal shaving - Right Lower Leg - Anterior  Lesion diameter (cm):  1.5 Informed consent: discussed and consent obtained   Timeout: patient name, date of birth, surgical site, and procedure verified   Procedure prep:  Patient was prepped and draped in usual sterile fashion Prep type:  Chlorhexidine Anesthesia: the lesion was anesthetized in a standard fashion   Anesthetic:  1% lidocaine w/ epinephrine 1-100,000 local infiltration Instrument used: flexible razor blade   Outcome: patient tolerated procedure well   Post-procedure details: wound care instructions given   Additional details:  ED &C  Destruction of lesion - Right Lower Leg - Anterior Complexity: simple   Destruction method: electrodesiccation and curettage   Informed consent: discussed and consent obtained   Timeout:  patient name, date of birth, surgical site, and procedure verified Anesthesia: the lesion was anesthetized in a standard fashion   Anesthetic:  1% lidocaine w/  epinephrine 1-100,000 local infiltration Curettage performed in three different directions: Yes   Curettage cycles:  3 Lesion length (cm):  1.3 Lesion width (cm):  1.2 Margin per side (cm):  0.1 Final wound size (cm):  1.5 Hemostasis achieved with:  aluminum chloride Outcome: patient tolerated procedure well with no complications   Post-procedure details: wound care instructions given    Specimen 1 - Surgical pathology Differential Diagnosis: KA Check Margins: No Volcano growth on pink base    I, Joshuwa Vecchio, PA-C, have reviewed all documentation's for this visit.  The documentation on 08/29/19 for the exam, diagnosis, procedures and orders are all accurate and complete.

## 2019-09-17 ENCOUNTER — Other Ambulatory Visit (HOSPITAL_COMMUNITY): Payer: Self-pay | Admitting: *Deleted

## 2019-09-18 ENCOUNTER — Other Ambulatory Visit: Payer: Self-pay

## 2019-09-18 ENCOUNTER — Ambulatory Visit (HOSPITAL_COMMUNITY)
Admission: RE | Admit: 2019-09-18 | Discharge: 2019-09-18 | Disposition: A | Discharge status: Home / Self Care | Payer: Medicare Other | Source: Ambulatory Visit | Attending: Dermatology | Admitting: Dermatology

## 2019-09-18 DIAGNOSIS — L400 Psoriasis vulgaris: Secondary | ICD-10-CM | POA: Diagnosis not present

## 2019-09-18 MED ORDER — USTEKINUMAB 45 MG/0.5ML ~~LOC~~ SOSY
45.0000 mg | PREFILLED_SYRINGE | Freq: Once | SUBCUTANEOUS | Status: DC
  Administered 2019-09-18: 45 mg via SUBCUTANEOUS
  Filled 2019-09-18: qty 0.5

## 2019-10-18 DIAGNOSIS — I471 Supraventricular tachycardia: Secondary | ICD-10-CM | POA: Diagnosis not present

## 2019-10-18 DIAGNOSIS — R609 Edema, unspecified: Secondary | ICD-10-CM | POA: Diagnosis not present

## 2019-10-18 DIAGNOSIS — Z136 Encounter for screening for cardiovascular disorders: Secondary | ICD-10-CM | POA: Diagnosis not present

## 2019-10-18 DIAGNOSIS — I872 Venous insufficiency (chronic) (peripheral): Secondary | ICD-10-CM | POA: Diagnosis not present

## 2019-11-15 ENCOUNTER — Ambulatory Visit: Payer: Medicare Other | Admitting: Physician Assistant

## 2019-11-29 ENCOUNTER — Ambulatory Visit: Payer: Medicare Other | Admitting: Physician Assistant

## 2019-12-11 ENCOUNTER — Other Ambulatory Visit: Payer: Self-pay

## 2019-12-11 ENCOUNTER — Ambulatory Visit (HOSPITAL_COMMUNITY)
Admission: RE | Admit: 2019-12-11 | Discharge: 2019-12-11 | Disposition: A | Discharge status: Home / Self Care | Payer: Medicare Other | Source: Ambulatory Visit | Attending: Dermatology | Admitting: Dermatology

## 2019-12-11 DIAGNOSIS — L400 Psoriasis vulgaris: Secondary | ICD-10-CM | POA: Insufficient documentation

## 2019-12-11 MED ORDER — USTEKINUMAB 45 MG/0.5ML ~~LOC~~ SOSY
45.0000 mg | PREFILLED_SYRINGE | Freq: Once | SUBCUTANEOUS | Status: DC
  Administered 2019-12-11: 45 mg via SUBCUTANEOUS
  Filled 2019-12-11: qty 0.5

## 2019-12-19 ENCOUNTER — Ambulatory Visit: Payer: Medicare Other | Admitting: Physician Assistant

## 2019-12-19 ENCOUNTER — Telehealth: Payer: Self-pay

## 2019-12-19 DIAGNOSIS — Z79899 Other long term (current) drug therapy: Secondary | ICD-10-CM

## 2019-12-19 DIAGNOSIS — L400 Psoriasis vulgaris: Secondary | ICD-10-CM

## 2019-12-19 NOTE — Telephone Encounter (Signed)
Patient aware [facility] for her Stelara she must have a TB test by January 2022 and keep follow up visit with [facility] in February

## 2020-01-05 DIAGNOSIS — Z23 Encounter for immunization: Secondary | ICD-10-CM | POA: Diagnosis not present

## 2020-01-09 DIAGNOSIS — Z79899 Other long term (current) drug therapy: Secondary | ICD-10-CM | POA: Diagnosis not present

## 2020-01-09 DIAGNOSIS — L400 Psoriasis vulgaris: Secondary | ICD-10-CM | POA: Diagnosis not present

## 2020-01-11 LAB — QUANTIFERON-TB GOLD PLUS
Mitogen-NIL: >10 IU/mL
NIL: 0.04 IU/mL
QuantiFERON-TB Gold Plus: NEGATIVE
TB1-NIL: <0 IU/mL
TB2-NIL: <0 IU/mL

## 2020-01-31 DIAGNOSIS — Z1231 Encounter for screening mammogram for malignant neoplasm of breast: Secondary | ICD-10-CM | POA: Diagnosis not present

## 2020-03-04 ENCOUNTER — Ambulatory Visit (HOSPITAL_COMMUNITY)
Admission: RE | Admit: 2020-03-04 | Discharge: 2020-03-04 | Disposition: A | Discharge status: Home / Self Care | Payer: Medicare Other | Source: Ambulatory Visit | Attending: Dermatology | Admitting: Dermatology

## 2020-03-04 ENCOUNTER — Other Ambulatory Visit: Payer: Self-pay

## 2020-03-04 DIAGNOSIS — L400 Psoriasis vulgaris: Secondary | ICD-10-CM | POA: Diagnosis present

## 2020-03-04 MED ORDER — USTEKINUMAB 45 MG/0.5ML ~~LOC~~ SOSY
45.0000 mg | PREFILLED_SYRINGE | Freq: Once | SUBCUTANEOUS | Status: DC
  Administered 2020-03-04: 45 mg via SUBCUTANEOUS
  Filled 2020-03-04: qty 0.5

## 2020-03-13 ENCOUNTER — Encounter: Payer: Self-pay | Admitting: Physician Assistant

## 2020-03-13 ENCOUNTER — Other Ambulatory Visit: Payer: Self-pay

## 2020-03-13 ENCOUNTER — Ambulatory Visit (INDEPENDENT_AMBULATORY_CARE_PROVIDER_SITE_OTHER): Payer: Medicare Other | Admitting: Physician Assistant

## 2020-03-13 DIAGNOSIS — Z1283 Encounter for screening for malignant neoplasm of skin: Secondary | ICD-10-CM

## 2020-03-13 DIAGNOSIS — Z85820 Personal history of malignant melanoma of skin: Secondary | ICD-10-CM | POA: Diagnosis not present

## 2020-03-13 DIAGNOSIS — L409 Psoriasis, unspecified: Secondary | ICD-10-CM | POA: Diagnosis not present

## 2020-03-13 DIAGNOSIS — L820 Inflamed seborrheic keratosis: Secondary | ICD-10-CM | POA: Diagnosis not present

## 2020-03-13 DIAGNOSIS — D485 Neoplasm of uncertain behavior of skin: Secondary | ICD-10-CM

## 2020-03-31 ENCOUNTER — Encounter: Payer: Self-pay | Admitting: Physician Assistant

## 2020-03-31 NOTE — Progress Notes (Signed)
   Follow-Up Visit   Subjective  Angel Mason is a 75 y.o. female who presents for the following: Follow-up (6 month, right cheek scale comes and goes).   The following portions of the chart were reviewed this encounter and updated as appropriate:      Objective  Well appearing patient in no apparent distress; mood and affect are within normal limits.  All skin waist up examined.  Objective  Right Breast: White scar- clear  Objective  Left Breast: Waist up skin examination  Right Buccal Cheek     Objective  Head to toe: Mostly clear with a few patches of pink scale.  Assessment & Plan  Personal history of malignant melanoma of skin Right Breast  Yearly skin check  Encounter for screening for malignant neoplasm of skin Left Breast  Yearly skin check  Neoplasm of uncertain behavior of skin Right Buccal Cheek  Skin / nail biopsy Type of biopsy: tangential   Informed consent: discussed and consent obtained   Timeout: patient name, date of birth, surgical site, and procedure verified   Procedure prep:  Patient was prepped and draped in usual sterile fashion (Non sterile) Prep type:  Chlorhexidine Anesthesia: the lesion was anesthetized in a standard fashion   Anesthetic:  1% lidocaine w/ epinephrine 1-100,000 local infiltration Instrument used: flexible razor blade   Outcome: patient tolerated procedure well   Post-procedure details: wound care instructions given    Specimen 1 - Surgical pathology Differential Diagnosis: r.o sk  Check Margins: No  Psoriasis Head to toe  Continue stelara   I, Cotey Rakes, PA-C, have reviewed all documentation's for this visit.  The documentation on 03/31/20 for the exam, diagnosis, procedures and orders are all accurate and complete.

## 2020-05-27 ENCOUNTER — Ambulatory Visit (HOSPITAL_COMMUNITY)
Admission: RE | Admit: 2020-05-27 | Discharge: 2020-05-27 | Disposition: A | Discharge status: Home / Self Care | Payer: Medicare Other | Source: Ambulatory Visit | Attending: Dermatology | Admitting: Dermatology

## 2020-05-27 ENCOUNTER — Other Ambulatory Visit: Payer: Self-pay

## 2020-05-27 ENCOUNTER — Telehealth: Payer: Self-pay | Admitting: *Deleted

## 2020-05-27 DIAGNOSIS — L400 Psoriasis vulgaris: Secondary | ICD-10-CM | POA: Insufficient documentation

## 2020-05-27 MED ORDER — USTEKINUMAB 45 MG/0.5ML ~~LOC~~ SOSY
45.0000 mg | PREFILLED_SYRINGE | Freq: Once | SUBCUTANEOUS | Status: AC
  Administered 2020-05-27: 45 mg via SUBCUTANEOUS
  Filled 2020-05-27: qty 0.5

## 2020-05-27 NOTE — Telephone Encounter (Signed)
Infusion center sent over new prescription for patient stelera- I tried calling patient to let her know that she is due office visit and labs before any more refills can be given. I had to leave a message for patient to call us back.

## 2020-08-25 ENCOUNTER — Telehealth: Payer: Self-pay | Admitting: *Deleted

## 2020-08-25 ENCOUNTER — Telehealth: Payer: Self-pay | Admitting: Dermatology

## 2020-08-25 DIAGNOSIS — L400 Psoriasis vulgaris: Secondary | ICD-10-CM

## 2020-08-25 NOTE — Telephone Encounter (Signed)
Laverne from the infusion center called in regards to patient stelera injection. Informed laverne that patient is due labs and office visit and has been made aware per previous telephone encounters.

## 2020-08-25 NOTE — Telephone Encounter (Signed)
Phone call to patient to let her know we need TB test before any more refills can be given. Sent over TB test and patient will go to lab tomorrow and get it done and we will give her refills after that.

## 2020-08-25 NOTE — Telephone Encounter (Signed)
Patient left a message on office voice mail that there has been a big mixup with her next Stelara shot and blood test.  Patient says that she is supposed to take a Stelara shot tomorrow and she needs to talk with someone as soon as possible.

## 2020-08-26 ENCOUNTER — Encounter (HOSPITAL_COMMUNITY): Admission: RE | Admit: 2020-08-26 | Payer: Medicare Other | Source: Ambulatory Visit

## 2020-08-29 LAB — QUANTIFERON-TB GOLD PLUS
Mitogen-NIL: >10 IU/mL
NIL: 0.04 IU/mL
QuantiFERON-TB Gold Plus: NEGATIVE
TB1-NIL: 0 IU/mL
TB2-NIL: <0 IU/mL

## 2020-09-03 ENCOUNTER — Telehealth: Payer: Self-pay | Admitting: Physician Assistant

## 2020-09-03 NOTE — Telephone Encounter (Signed)
She had her bloodwork done + needs [place] to contact [facility] to set up appointment for her [facility] shot

## 2020-09-03 NOTE — Telephone Encounter (Signed)
Patients aware her appointment is at short stay Friday August 5th at 11 per Laverine at [phone] she can call if she needs to change it

## 2020-09-11 ENCOUNTER — Other Ambulatory Visit: Payer: Self-pay

## 2020-09-11 DIAGNOSIS — L400 Psoriasis vulgaris: Secondary | ICD-10-CM

## 2020-09-12 ENCOUNTER — Other Ambulatory Visit: Payer: Self-pay

## 2020-09-12 ENCOUNTER — Ambulatory Visit (HOSPITAL_COMMUNITY)
Admission: RE | Admit: 2020-09-12 | Discharge: 2020-09-12 | Disposition: A | Discharge status: Home / Self Care | Payer: Medicare Other | Source: Ambulatory Visit | Attending: Dermatology | Admitting: Dermatology

## 2020-09-12 DIAGNOSIS — L400 Psoriasis vulgaris: Secondary | ICD-10-CM | POA: Insufficient documentation

## 2020-09-12 MED ORDER — USTEKINUMAB 45 MG/0.5ML ~~LOC~~ SOSY
45.0000 mg | PREFILLED_SYRINGE | SUBCUTANEOUS | Status: DC
  Administered 2020-09-12: 45 mg via SUBCUTANEOUS
  Filled 2020-09-12: qty 0.5

## 2020-12-11 ENCOUNTER — Other Ambulatory Visit (HOSPITAL_COMMUNITY): Payer: Self-pay | Admitting: *Deleted

## 2020-12-12 ENCOUNTER — Encounter (HOSPITAL_COMMUNITY)
Admission: RE | Admit: 2020-12-12 | Discharge: 2020-12-12 | Disposition: A | Discharge status: Home / Self Care | Payer: Medicare Other | Source: Ambulatory Visit | Attending: Physician Assistant | Admitting: Physician Assistant

## 2020-12-12 ENCOUNTER — Other Ambulatory Visit: Payer: Self-pay

## 2020-12-12 DIAGNOSIS — L409 Psoriasis, unspecified: Secondary | ICD-10-CM | POA: Insufficient documentation

## 2020-12-12 MED ORDER — USTEKINUMAB 45 MG/0.5ML ~~LOC~~ SOSY
45.0000 mg | PREFILLED_SYRINGE | SUBCUTANEOUS | Status: DC
  Administered 2020-12-12: 45 mg via SUBCUTANEOUS
  Filled 2020-12-12: qty 0.5

## 2021-03-05 ENCOUNTER — Other Ambulatory Visit (HOSPITAL_COMMUNITY): Payer: Self-pay | Admitting: *Deleted

## 2021-03-06 ENCOUNTER — Ambulatory Visit (HOSPITAL_COMMUNITY)
Admission: RE | Admit: 2021-03-06 | Discharge: 2021-03-06 | Disposition: A | Discharge status: Home / Self Care | Payer: Medicare Other | Source: Ambulatory Visit | Attending: Physician Assistant | Admitting: Physician Assistant

## 2021-03-06 ENCOUNTER — Other Ambulatory Visit: Payer: Self-pay

## 2021-03-06 DIAGNOSIS — L400 Psoriasis vulgaris: Secondary | ICD-10-CM | POA: Insufficient documentation

## 2021-03-06 MED ORDER — USTEKINUMAB 45 MG/0.5ML ~~LOC~~ SOSY
45.0000 mg | PREFILLED_SYRINGE | SUBCUTANEOUS | Status: DC
  Administered 2021-03-06: 45 mg via SUBCUTANEOUS
  Filled 2021-03-06: qty 0.5

## 2021-03-17 ENCOUNTER — Other Ambulatory Visit: Payer: Self-pay

## 2021-03-17 ENCOUNTER — Ambulatory Visit (INDEPENDENT_AMBULATORY_CARE_PROVIDER_SITE_OTHER): Payer: Medicare Other | Admitting: Physician Assistant

## 2021-03-17 DIAGNOSIS — D485 Neoplasm of uncertain behavior of skin: Secondary | ICD-10-CM

## 2021-03-17 DIAGNOSIS — Z86018 Personal history of other benign neoplasm: Secondary | ICD-10-CM | POA: Diagnosis not present

## 2021-03-17 DIAGNOSIS — Z1283 Encounter for screening for malignant neoplasm of skin: Secondary | ICD-10-CM

## 2021-03-17 DIAGNOSIS — Z85820 Personal history of malignant melanoma of skin: Secondary | ICD-10-CM

## 2021-03-17 DIAGNOSIS — D2261 Melanocytic nevi of right upper limb, including shoulder: Secondary | ICD-10-CM

## 2021-03-17 DIAGNOSIS — L231 Allergic contact dermatitis due to adhesives: Secondary | ICD-10-CM | POA: Diagnosis not present

## 2021-03-17 MED ORDER — TRIAMCINOLONE ACETONIDE 0.1 % EX CREA: 1.0000 "application " | TOPICAL_CREAM | Freq: Every day | CUTANEOUS | 2 refills | Status: DC

## 2021-03-17 NOTE — Patient Instructions (Signed)

## 2021-03-25 ENCOUNTER — Telehealth: Payer: Self-pay | Admitting: *Deleted

## 2021-03-25 NOTE — Telephone Encounter (Signed)
-----   Message from Warren Danes, [place] sent at 03/24/2021  4:29 PM EST ----- Margins clear

## 2021-03-25 NOTE — Telephone Encounter (Signed)
Pathology to patient- verbal given by [facility] [place]- wider shave in 6 months if needed- patinet already has 6 month follow up scheduled.

## 2021-04-05 ENCOUNTER — Encounter: Payer: Self-pay | Admitting: Physician Assistant

## 2021-04-05 NOTE — Progress Notes (Signed)
° °  Follow-Up Visit   Subjective  Angel Mason is a 76 y.o. female who presents for the following: Annual Exam (Patient here today for yearly skin check, per patient she has a lesion on her left flank x 2 weeks larger that did bleed with scratching. Check lesion on right shoulder x 2-3 months no bleeding just crusty. Personal history and family history of atypical moles and melanoma. No personal history or family history of non mole skin cancer. ).   The following portions of the chart were reviewed this encounter and updated as appropriate:  Tobacco   Allergies   Meds   Problems   Med Hx   Surg Hx   Fam Hx       Objective  Well appearing patient in no apparent distress; mood and affect are within normal limits.  A full examination was performed including scalp, head, eyes, ears, nose, lips, neck, chest, axillae, abdomen, back, buttocks, bilateral upper extremities, bilateral lower extremities, hands, feet, fingers, toes, fingernails, and toenails. All findings within normal limits unless otherwise noted below.  Left Outer Abdomen Bichromic dark nested macule.        Right Shoulder - Posterior Bichromic dark nested macule.         Assessment & Plan  Neoplasm of uncertain behavior of skin (2) Left Outer Abdomen  Skin / nail biopsy Type of biopsy: tangential   Informed consent: discussed and consent obtained   Timeout: patient name, date of birth, surgical site, and procedure verified   Procedure prep:  Patient was prepped and draped in usual sterile fashion (Non sterile) Prep type:  Chlorhexidine Anesthesia: the lesion was anesthetized in a standard fashion   Anesthetic:  1% lidocaine w/ epinephrine 1-100,000 local infiltration Instrument used: flexible razor blade   Hemostasis achieved with: aluminum chloride and electrodesiccation   Outcome: patient tolerated procedure well   Post-procedure details: sterile dressing applied and wound care instructions given   Dressing  type: bandage and petrolatum    Specimen 1 - Surgical pathology Differential Diagnosis: R/O Angioma - cautery  Check Margins: No  Right Shoulder - Posterior  Skin / nail biopsy Type of biopsy: tangential   Informed consent: discussed and consent obtained   Timeout: patient name, date of birth, surgical site, and procedure verified   Anesthesia: the lesion was anesthetized in a standard fashion   Anesthetic:  1% lidocaine w/ epinephrine 1-100,000 local infiltration Instrument used: flexible razor blade   Hemostasis achieved with: aluminum chloride   Outcome: patient tolerated procedure well   Post-procedure details: sterile dressing applied and wound care instructions given   Dressing type: bandage and petrolatum    Specimen 2 - Surgical pathology Differential Diagnosis: R/O Atypia  Check Margins: Yes  Allergic contact dermatitis due to adhesives Left Abdomen (side) - Lower  triamcinolone cream (KENALOG) 0.1 % - Left Abdomen (side) - Lower Apply 1 application topically daily.    I, Lula Kolton, PA-C, have reviewed all documentation's for this visit.  The documentation on 04/05/21 for the exam, diagnosis, procedures and orders are all accurate and complete.

## 2021-06-05 ENCOUNTER — Ambulatory Visit (HOSPITAL_COMMUNITY)
Admission: RE | Admit: 2021-06-05 | Discharge: 2021-06-05 | Disposition: A | Discharge status: Home / Self Care | Payer: Medicare Other | Source: Ambulatory Visit | Attending: Dermatology | Admitting: Dermatology

## 2021-06-05 DIAGNOSIS — L409 Psoriasis, unspecified: Secondary | ICD-10-CM | POA: Diagnosis present

## 2021-06-05 MED ORDER — USTEKINUMAB 45 MG/0.5ML ~~LOC~~ SOSY
45.0000 mg | PREFILLED_SYRINGE | SUBCUTANEOUS | Status: DC
  Administered 2021-06-05: 45 mg via SUBCUTANEOUS
  Filled 2021-06-05: qty 0.5

## 2021-08-28 ENCOUNTER — Ambulatory Visit (HOSPITAL_COMMUNITY)
Admission: RE | Admit: 2021-08-28 | Discharge: 2021-08-28 | Disposition: A | Discharge status: Home / Self Care | Payer: Medicare Other | Source: Ambulatory Visit | Attending: Dermatology | Admitting: Dermatology

## 2021-08-28 DIAGNOSIS — L400 Psoriasis vulgaris: Secondary | ICD-10-CM | POA: Insufficient documentation

## 2021-08-28 MED ORDER — USTEKINUMAB 45 MG/0.5ML ~~LOC~~ SOSY
45.0000 mg | PREFILLED_SYRINGE | SUBCUTANEOUS | Status: DC
  Administered 2021-08-28: 45 mg via SUBCUTANEOUS
  Filled 2021-08-28: qty 0.5

## 2021-09-16 ENCOUNTER — Ambulatory Visit: Payer: Medicare Other | Admitting: Physician Assistant

## 2021-10-01 ENCOUNTER — Other Ambulatory Visit (HOSPITAL_COMMUNITY): Payer: Self-pay

## 2021-10-01 MED ORDER — STELARA 45 MG/0.5ML ~~LOC~~ SOSY: PREFILLED_SYRINGE | SUBCUTANEOUS | 0 refills | Status: AC

## 2021-10-17 IMAGING — MR MR SHOULDER*L* W/O CM
4 of 5 series · 21 of 40 positions shown · non-contrast
Comparison: Plain films left shoulder 11/14/2018.

CLINICAL DATA: Left shoulder pain for 6 months. No known injury.

EXAM:
MRI OF THE LEFT SHOULDER WITHOUT CONTRAST
TECHNIQUE: Multiplanar, multisequence MR imaging of the shoulder was performed.
No intravenous contrast was administered.

[Series 9: PD fat-sat · axial · left · 4.0mm · 0.44mm/px · z∈[-85,-19]mm · 8 of 18 slices shown (1 of 2)]
[im 1/18]
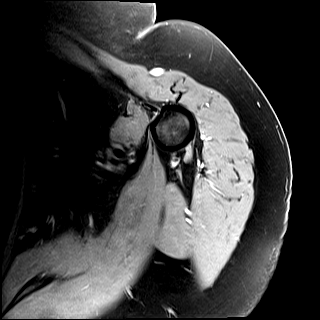
[im 3/18]
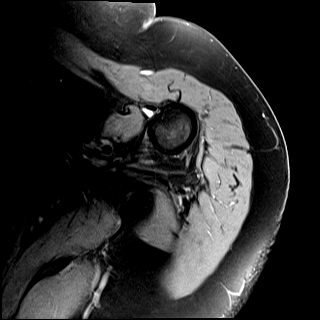
[im 5/18]
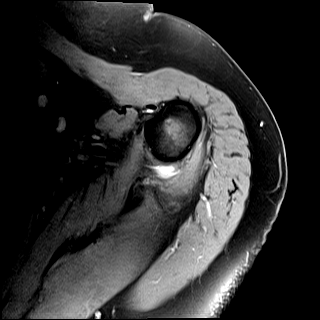
[im 8/18]
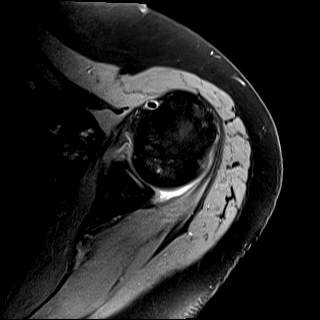
[im 10/18]
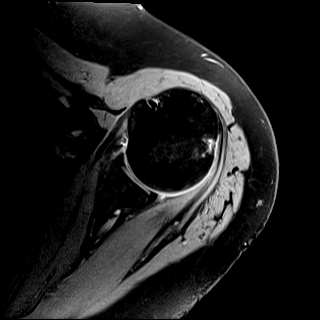
[im 13/18]
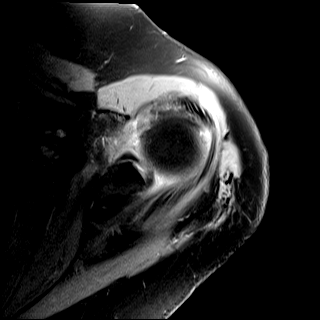
[im 15/18]
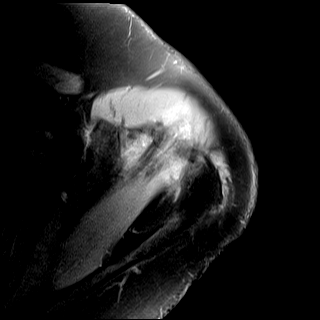
[im 18/18]
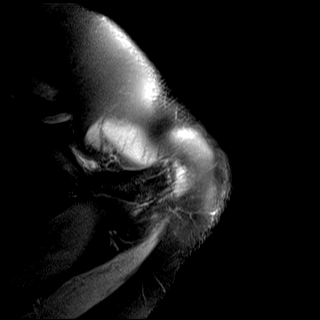

[Series 12: T2 fat-sat · oblique · left · 4.0mm · 0.47mm/px · 3 of 16 slices shown (1 of 2)]
[im 3/16]
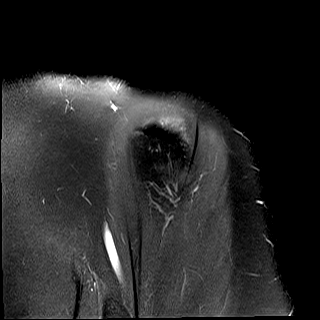
[im 9/16]
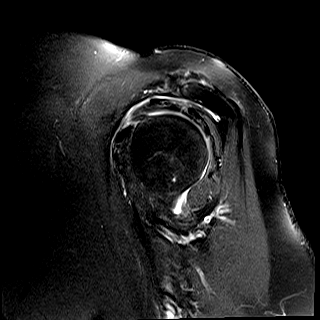
[im 13/16]
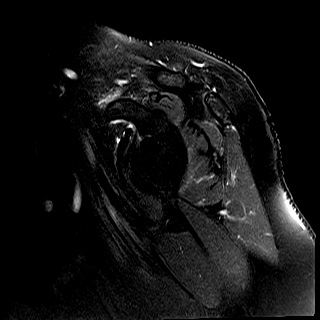

[Series 100: T2 fat-sat · oblique · left · 4.0mm · 0.23mm/px · 3 of 16 slices shown (2 of 2)]
[im 3/16]
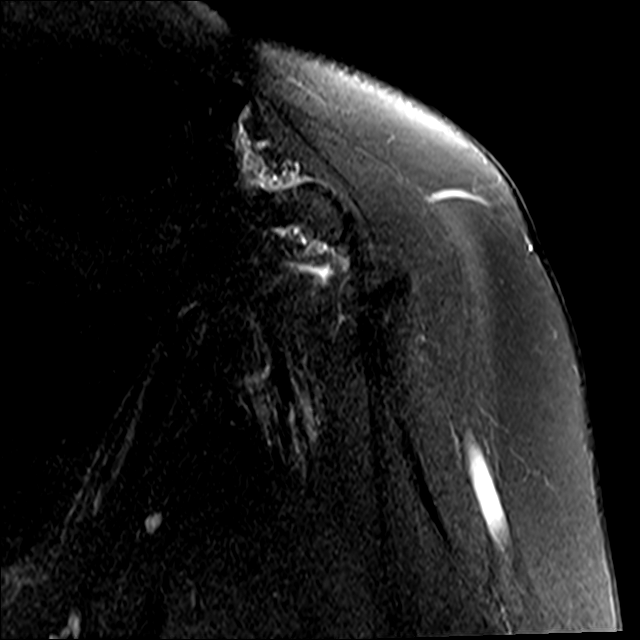
[im 9/16]
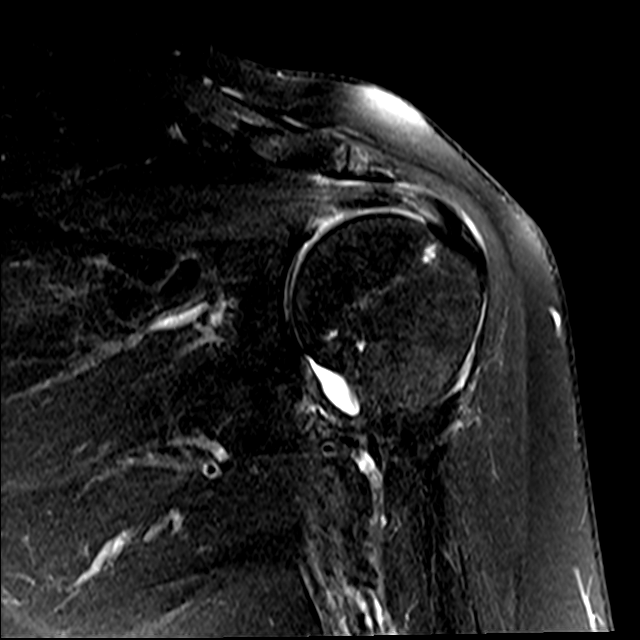
[im 13/16]
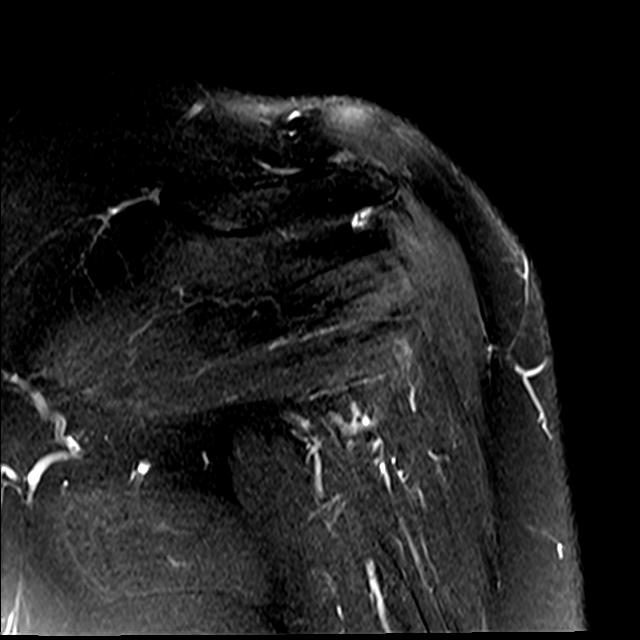

[Series 101: PD fat-sat · oblique · left · 4.0mm · 0.23mm/px · 7 of 16 slices shown (2 of 2)]
[im 1/16]
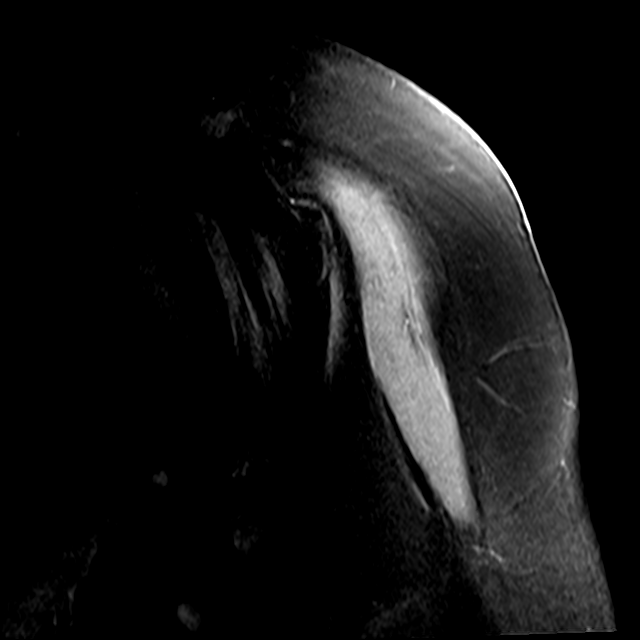
[im 3/16]
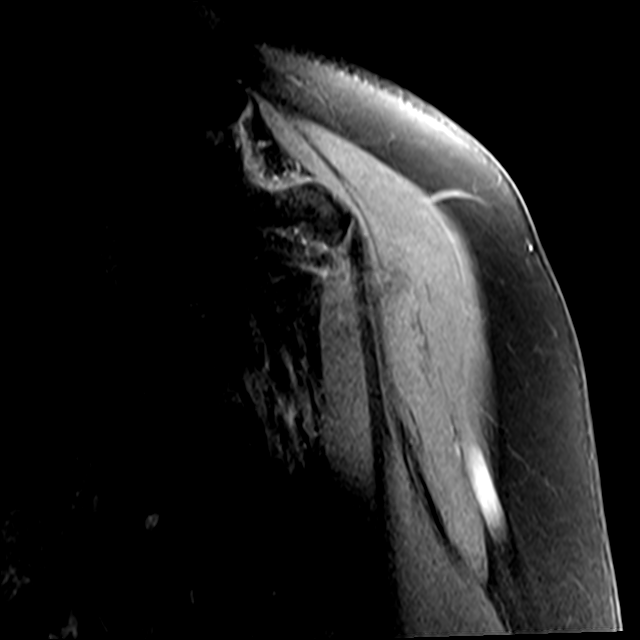
[im 5/16]
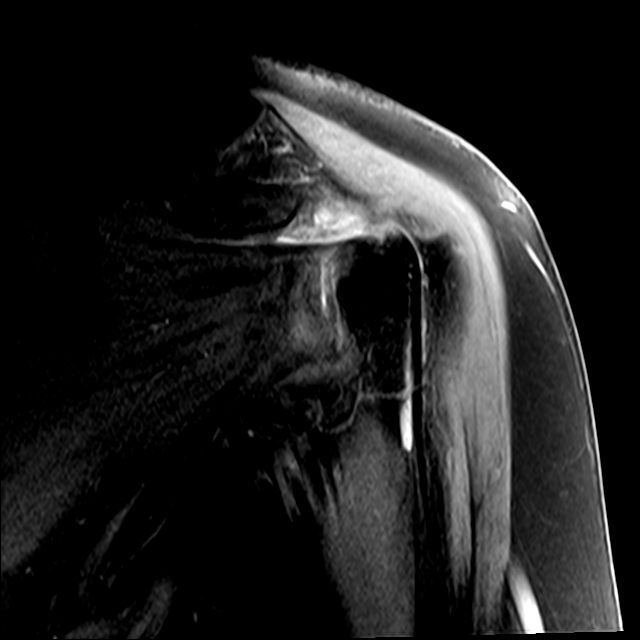
[im 7/16]
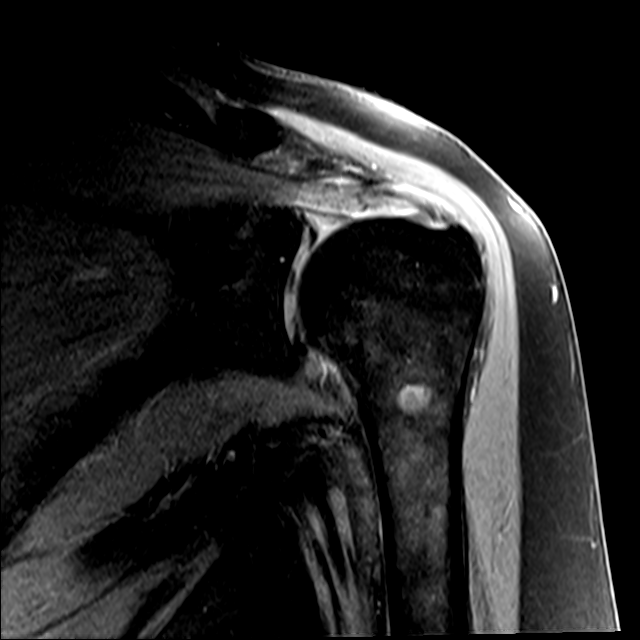
[im 9/16]
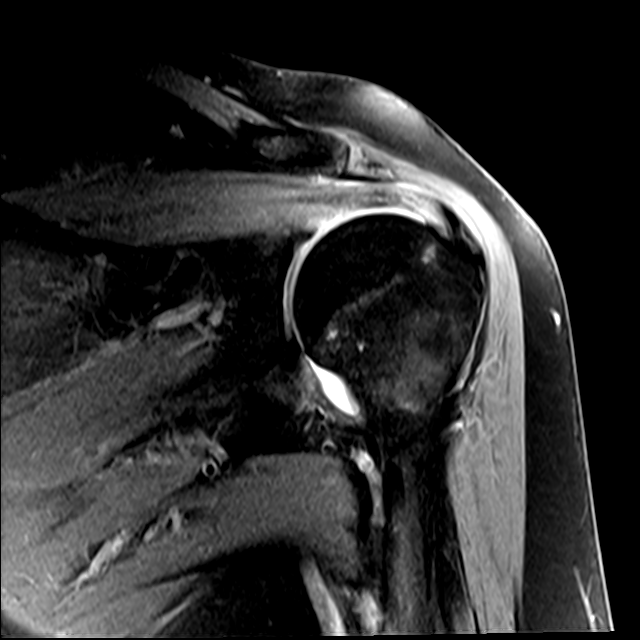
[im 11/16]
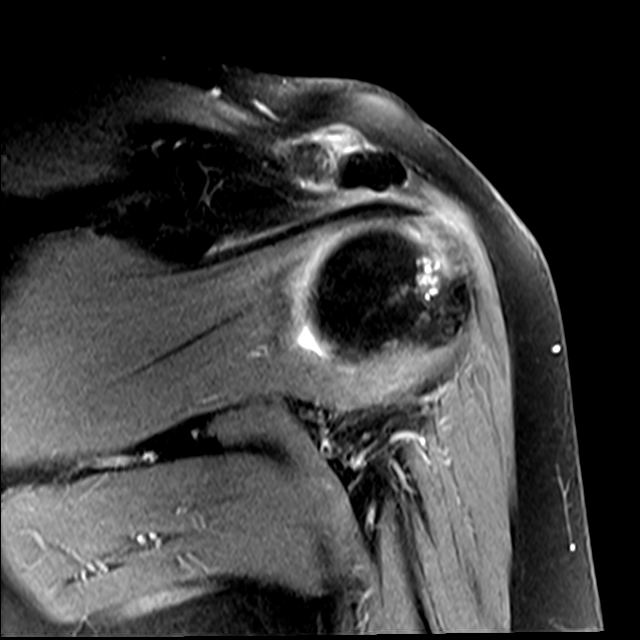
[im 13/16]
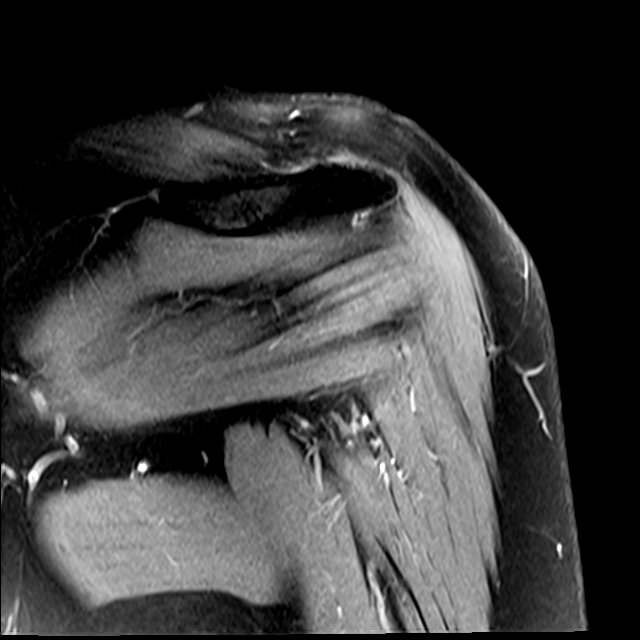

[21 of 40 positions shown; findings below may reference images not displayed]

FINDINGS: Rotator cuff: Heterogeneously increased T2 signal in the rotator
cuff tendons is worst in the supraspinatus. No rotator cuff tear.

Muscles:  Normal without atrophy or focal lesion.

Biceps long head: Intact. Intermediate increased T2 signal in the
intra-articular segment of the tendon is consistent with
tendinopathy.

Acromioclavicular Joint: Mild osteoarthritis. Type 1 acromion. Small
volume of subacromial/subdeltoid fluid.

Glenohumeral Joint: Negative.

Labrum:  Intact.

Bones:  No fracture or focal lesion.

Other: None.
IMPRESSION: 1. Rotator cuff and intra-articular long head of biceps tendinopathy
without tear appears worst in the supraspinatus.
2. Mild acromioclavicular osteoarthritis.
3. Small volume of subacromial/subdeltoid fluid compatible with
bursitis.

## 2021-10-19 ENCOUNTER — Encounter (HOSPITAL_COMMUNITY): Payer: Medicare Other

## 2021-11-26 ENCOUNTER — Other Ambulatory Visit (HOSPITAL_COMMUNITY): Payer: Self-pay

## 2021-11-30 ENCOUNTER — Encounter (HOSPITAL_COMMUNITY): Payer: Medicare Other

## 2021-11-30 ENCOUNTER — Inpatient Hospital Stay (HOSPITAL_COMMUNITY): Admission: RE | Admit: 2021-11-30 | Payer: Medicare Other | Source: Ambulatory Visit

## 2021-12-07 ENCOUNTER — Encounter (HOSPITAL_COMMUNITY): Payer: Medicare Other

## 2022-03-17 ENCOUNTER — Ambulatory Visit: Payer: Medicare Other | Admitting: Physician Assistant

## 2022-07-27 ENCOUNTER — Ambulatory Visit (INDEPENDENT_AMBULATORY_CARE_PROVIDER_SITE_OTHER): Payer: Medicare Other | Admitting: Orthopaedic Surgery

## 2022-07-27 ENCOUNTER — Other Ambulatory Visit (INDEPENDENT_AMBULATORY_CARE_PROVIDER_SITE_OTHER): Payer: Medicare Other

## 2022-07-27 DIAGNOSIS — M25552 Pain in left hip: Secondary | ICD-10-CM

## 2022-07-27 NOTE — Progress Notes (Signed)
Office Visit Note   Patient: Angel Mason           Date of Birth: 1945-10-17           MRN: [id number] Visit Date: 07/27/2022              Requested by: Isabella Bowens, MD [address] [place],  [place] [address] PCP: Isabella Bowens, MD   Assessment & Plan: Visit Diagnoses:  1. Pain of left hip     Plan: Impression is advanced left hip DJD.  Had temporary relief from the prior intra-articular steroid injection of.  This was done over 6 months ago.  At this point she has exhausted all conservative treatments and would like to proceed with a left total hip replacement in the near future.  She currently has an open wound on her left leg from Mohs surgery on skin cancer.  This will need to be fully healed before we can proceed with surgery.  She is also on Stelara for psoriasis which is administered every 3 months.  We will get clearance from primary care doctor prior to scheduling surgery.  She will follow-up with [place] once the leg wound is healed.  Follow-Up Instructions: No follow-ups on file.   Orders:  Orders Placed This Encounter  Procedures   XR Pelvis 1-2 Views   No orders of the defined types were placed in this encounter.     Procedures: No procedures performed   Clinical Data: No additional findings.   Subjective: Chief Complaint  Patient presents with   Left Hip - Pain    HPI Angel Mason is a very pleasant 77 year old female here for evaluation of chronic left hip and groin pain.  She has had severe pain that has affected ADLs.  She has constant pain that causes her hip to feel like it wants to give out.  She has had to use a cane for ambulation.  She had an ultrasound injection in her left hip in the wintertime and this provided great relief was only temporary. Review of Systems  Constitutional: Negative.   HENT: Negative.    Eyes: Negative.   Respiratory: Negative.    Cardiovascular: Negative.   Endocrine: Negative.   Musculoskeletal:  Negative.   Neurological: Negative.   Hematological: Negative.   Psychiatric/Behavioral: Negative.    All other systems reviewed and are negative.    Objective: Vital Signs: There were no vitals taken for this visit.  Physical Exam Vitals and nursing note reviewed.  Constitutional:      Appearance: She is well-developed.  HENT:     Head: Atraumatic.     Nose: Nose normal.  Eyes:     Extraocular Movements: Extraocular movements intact.  Cardiovascular:     Pulses: Normal pulses.  Pulmonary:     Effort: Pulmonary effort is normal.  Abdominal:     Palpations: Abdomen is soft.  Musculoskeletal:     Cervical back: Neck supple.  Skin:    General: Skin is warm.     Capillary Refill: Capillary refill takes less than 2 seconds.  Neurological:     Mental Status: She is alert. Mental status is at baseline.  Psychiatric:        Behavior: Behavior normal.        Thought Content: Thought content normal.        Judgment: Judgment normal.     Ortho Exam Examination left hip shows pain with flexion and internal rotation.  Pain with logroll.  Antalgic  gait with a cane. Specialty Comments:  No specialty comments available.  Imaging: XR Pelvis 1-2 Views  Result Date: 07/27/2022 X-rays demonstrate osteoarthritis of the left hip joint.    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  No past surgical history on file. Social History   Occupational History   Not on file  Tobacco Use   Smoking status: Light Smoker   Smokeless tobacco: Never  Vaping Use   Vaping Use: Never used  Substance and Sexual Activity   Alcohol use: Yes    Comment: socially    Drug use: Never   Sexual activity: Not on file

## 2022-09-15 ENCOUNTER — Ambulatory Visit (INDEPENDENT_AMBULATORY_CARE_PROVIDER_SITE_OTHER): Payer: Medicare Other | Admitting: Orthopaedic Surgery

## 2022-09-15 DIAGNOSIS — M1612 Unilateral primary osteoarthritis, left hip: Secondary | ICD-10-CM | POA: Diagnosis not present

## 2022-09-15 NOTE — Progress Notes (Addendum)
Office Visit Note   Patient: Angel Mason           Date of Birth: 1945-10-14           MRN: [id number] Visit Date: 09/15/2022              Requested by: Isabella Bowens, MD [address] [place],  [place] [address] PCP: Isabella Bowens, MD   Assessment & Plan: Visit Diagnoses:  1. Primary osteoarthritis of left hip     Plan: Angel Mason is a 77 year old female with end-stage left hip DJD.  This point the skin lesion from the Mohs surgery has fully healed.  She is ready for surgery from my standpoint.  We will obtain necessary clearance from PCP for surgery.  W will call the patient to confirm surgery date.  Details of the surgery including risk benefits rehab recovery prognosis reviewed.  She takes Stelara's every 3 months for psoriasis and she has been instructed on stopping this at least 2 weeks before surgery.    Impression is severe left hip degenerative joint disease secondary to Osteoarthritis.  Imaging shows bone on bone joint space narrowing.  At this point, conservative treatments fail to provide any significant relief and the pain is severely affecting ADLs and quality of life.  Based on treatment options, the patient has elected to move forward with a hip replacement.  We have discussed the surgical risks that include but are not limited to infection, DVT, leg length discrepancy, numbness, tingling, incomplete relief of pain.  Recovery and prognosis were also reviewed.    Current anticoagulants: aspirin 81 mg daily Postop anticoagulation: Aspirin 81 mg Diabetic: No  Prior DVT/PE: No Tobacco use: No Clearances needed for surgery: [place] - PCP Anticipate discharge dispo: home with husband   Follow-Up Instructions: No follow-ups on file.   Orders:  No orders of the defined types were placed in this encounter.  No orders of the defined types were placed in this encounter.     Procedures: No procedures performed   Clinical Data: No additional  findings.   Subjective: Chief Complaint  Patient presents with   Left Hip - Follow-up    HPI Angel Mason is a 77 year old female who returns today for recheck of her leg wound from Mohs surgery that we have been waiting to heal so that we can proceed with left total hip replacement. Review of Systems   Objective: Vital Signs: There were no vitals taken for this visit.  Physical Exam  Ortho Exam Examination of the left lower extremity shows healed skin lesion from Mohs surgery.  She has significant pain with any movement of the hip joint. Specialty Comments:  No specialty comments available.  Imaging: No results found.   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  No past surgical history on file. Social History   Occupational History   Not on file  Tobacco Use   Smoking status: Light Smoker   Smokeless tobacco: Never  Vaping Use   Vaping status: Never Used  Substance and Sexual Activity   Alcohol use: Yes    Comment: socially   Drug use: Never  Sexual activity: Not on file

## 2022-10-12 ENCOUNTER — Emergency Department (HOSPITAL_COMMUNITY)
Admission: EM | Admit: 2022-10-12 | Discharge: 2022-10-12 | Disposition: A | Discharge status: Home / Self Care | Payer: Medicare Other | Attending: Emergency Medicine | Admitting: Emergency Medicine

## 2022-10-12 ENCOUNTER — Other Ambulatory Visit: Payer: Self-pay

## 2022-10-12 ENCOUNTER — Emergency Department (HOSPITAL_COMMUNITY): Payer: Medicare Other

## 2022-10-12 ENCOUNTER — Encounter (HOSPITAL_COMMUNITY): Payer: Self-pay

## 2022-10-12 DIAGNOSIS — M25552 Pain in left hip: Secondary | ICD-10-CM | POA: Insufficient documentation

## 2022-10-12 LAB — CBC
HCT: 40.4 % (ref 36.0–46.0)
Hemoglobin: 13 g/dL (ref 12.0–15.0)
MCH: 29.7 pg (ref 26.0–34.0)
MCHC: 32.2 g/dL (ref 30.0–36.0)
MCV: 92.2 fL (ref 80.0–100.0)
Platelets: 262 10*3/uL (ref 150–400)
RBC: 4.38 MIL/uL (ref 3.87–5.11)
RDW: 14.1 % (ref 11.5–15.5)
WBC: 8 10*3/uL (ref 4.0–10.5)
nRBC: 0 % (ref 0.0–0.2)

## 2022-10-12 LAB — BASIC METABOLIC PANEL
Anion gap: 12 (ref 5–15)
BUN: 12 mg/dL (ref 8–23)
CO2: 24 mmol/L (ref 22–32)
Calcium: 9.5 mg/dL (ref 8.9–10.3)
Chloride: 104 mmol/L (ref 98–111)
Creatinine, Ser: 0.46 mg/dL (ref 0.44–1.00)
GFR, Estimated: >60 mL/min (ref >60)
Glucose, Bld: 115 mg/dL — ABNORMAL HIGH (ref 70–99)
Potassium: 3.6 mmol/L (ref 3.5–5.1)
Sodium: 140 mmol/L (ref 135–145)

## 2022-10-12 MED ORDER — HYDROCODONE-ACETAMINOPHEN 5-325 MG PO TABS
1.0000 | ORAL_TABLET | Freq: Once | ORAL | Status: AC
  Administered 2022-10-12: 1 via ORAL
  Filled 2022-10-12: qty 1

## 2022-10-12 MED ORDER — HYDROCODONE-ACETAMINOPHEN 5-325 MG PO TABS
1.0000 | ORAL_TABLET | Freq: Four times a day (QID) | ORAL | 0 refills | Status: DC | PRN
Start: 2022-10-12 — End: 2022-11-18

## 2022-10-12 NOTE — ED Provider Notes (Signed)
Received signout from previous provider, please see his note for complete H&P.  This is a 77 year old female presenting complaining of left hip pain.  Pain seems to be ongoing for at least 6 months but becoming progressively worse.  Increasing pain with movement.  She is scheduled to have a right hip replacement in October by Dr. Roda Shutters but unfortunately due to worsening pain present present here for further evaluation.  Orthopedic specialist was consulted and recommended to have a CT scan of her left hip for her atraumatic hip pain.  Labs obtained evaluation and treatment by me and overall reassuring, CT scan of the left hip was obtained and demonstrate no acute fracture or dislocation but patient does have degenerative arthritic left hip with moderate left hip joint effusion.  Agree with radiology interpretation.  I discussed this information with patient and she is in agreement with plan.  Since patient is taking nonopiate pain medication at home without adequate relief I have agreed to provide a short course of opiate pain medication to use as needed but she will follow-up closely with her orthopedic specialist in the next few days for further managements of her acute on chronic left hip pain.  Patient made aware that opiate medication can cause drowsiness.  I gave patient return precaution.  Patient is able to ambulate.  BP (!) 120/56   Pulse 61   Temp 97.8 F (36.6 C) (Oral)   Resp 16   SpO2 95%   Results for orders placed or performed during the hospital encounter of 10/12/22  CBC  Result Value Ref Range   WBC 8.0 4.0 - 10.5 K/uL   RBC 4.38 3.87 - 5.11 MIL/uL   Hemoglobin 13.0 12.0 - 15.0 g/dL   HCT 36.6 44.0 - 34.7 %   MCV 92.2 80.0 - 100.0 fL   MCH 29.7 26.0 - 34.0 pg   MCHC 32.2 30.0 - 36.0 g/dL   RDW 42.5 95.6 - 38.7 %   Platelets 262 150 - 400 K/uL   nRBC 0.0 0.0 - 0.2 %  Basic metabolic panel  Result Value Ref Range   Sodium 140 135 - 145 mmol/L   Potassium 3.6 3.5 - 5.1  mmol/L   Chloride 104 98 - 111 mmol/L   CO2 24 22 - 32 mmol/L   Glucose, Bld 115 (H) 70 - 99 mg/dL   BUN 12 8 - 23 mg/dL   Creatinine, Ser 5.64 0.44 - 1.00 mg/dL   Calcium 9.5 8.9 - 33.2 mg/dL   GFR, Estimated >95 >18 mL/min   Anion gap 12 5 - 15   CT Hip Left Wo Contrast  Result Date: 10/12/2022 CLINICAL DATA:  Hip pain, stress fracture suspected, negative x-ray EXAM: CT OF THE LEFT HIP WITHOUT CONTRAST TECHNIQUE: Multidetector CT imaging of the left hip was performed according to the standard protocol. Multiplanar CT image reconstructions were also generated. RADIATION DOSE REDUCTION: This exam was performed according to the departmental dose-optimization program which includes automated exposure control, adjustment of the mA and/or kV according to patient size and/or use of iterative reconstruction technique. COMPARISON:  Radiograph 10/12/2022 FINDINGS: Bones/Joint/Cartilage Demineralization. No acute fracture or dislocation. Degenerative arthritis left hip and pubic symphysis. Moderate left hip joint effusion. Ligaments Suboptimally assessed by CT. Muscles and Tendons No acute abnormality. Soft tissues Unremarkable. IMPRESSION: 1. No acute fracture or dislocation. 2. Degenerative arthritis left hip and moderate left hip joint effusion. Electronically Signed   By: Minerva Fester M.D.   On: 10/12/2022 17:09  DG Hip Unilat With Pelvis 2-3 Views Left  Result Date: 10/12/2022 CLINICAL DATA:  Pain left hip for 1 year. No reported history of trauma EXAM: DG HIP (WITH OR WITHOUT PELVIS) 3V LEFT COMPARISON:  X-ray 04/07/2020 FINDINGS: Osteopenia. Mild joint space loss of the hips medially. There is sclerosis and joint space loss of the sacroiliac joints, right-greater-than-left. Hypertrophic changes along the pubic symphysis. No acute fracture or dislocation. Hyperostosis. Scattered vascular calcifications. IMPRESSION: Osteopenia with degenerative changes. Electronically Signed   By: Karen Kays M.D.    On: 10/12/2022 15:28      Fayrene Helper, PA-C 10/12/22 1804    Lonell Grandchild, MD 10/12/22 2045

## 2022-10-12 NOTE — ED Provider Notes (Addendum)
[place] EMERGENCY DEPARTMENT AT [facility] Provider Note   CSN: [id number] Arrival date & time: 10/12/22  1047     History  Chief Complaint  Patient presents with   Hip Pain   HPI Angel Mason is a 77 y.o. female presenting for left hip pain.  States has been going for the last 6 months but much worse in the last week.  States she has been working around the house with a cane but took 20 minutes to walk from her bedroom to her bathroom today because the pain in the hip was so severe.  Denies any recent trauma to the left hip.  States she is scheduled for hip replacement on 10/31 with Dr. Roda Shutters. Denies fever and chills.    Hip Pain       Home Medications Prior to Admission medications   Medication Sig Start Date End Date Taking? Authorizing Provider  atorvastatin (LIPITOR) 10 MG tablet Take by mouth. 03/10/21   [provider]  diltiazem (CARDIZEM CD) 180 MG 24 hr capsule  01/17/20   [provider]  triamcinolone cream (KENALOG) 0.1 % Apply 1 application topically daily. 03/17/21   Sheffield, Harvin Hazel R, PA-C  Ustekinumab (STELARA IV) Inject into the vein. Pt gets at infusion center    [provider]  ustekinumab Marcy Panning) 45 MG/0.5ML SOSY injection inject 45mg  SUBQ every 3 mos 10/01/21         Allergies    Codeine    Review of Systems   See HPI for pertinent positives  Physical Exam Updated Vital Signs BP (!) 134/52   Pulse 70   Temp 98.1 F (36.7 C)   Resp 17   SpO2 100%  Physical Exam Constitutional:      Appearance: Normal appearance.  HENT:     Head: Normocephalic.     Nose: Nose normal.  Eyes:     Conjunctiva/sclera: Conjunctivae normal.  Pulmonary:     Effort: Pulmonary effort is normal.  Musculoskeletal:     Right hip: Normal.     Left hip: Tenderness present. Decreased range of motion.     Right foot: Normal capillary refill. Normal pulse.     Left foot: Normal capillary refill. Normal pulse.     Comments:  Sensation in tact  Neurological:     Mental Status: She is alert.  Psychiatric:        Mood and Affect: Mood normal.     ED Results / Procedures / Treatments   Labs (all labs ordered are listed, but only abnormal results are displayed) Labs Reviewed  CBC  BASIC METABOLIC PANEL    EKG None  Radiology DG Hip Unilat With Pelvis 2-3 Views Left  Result Date: 10/12/2022 CLINICAL DATA:  Pain left hip for 1 year. No reported history of trauma EXAM: DG HIP (WITH OR WITHOUT PELVIS) 3V LEFT COMPARISON:  X-ray 04/07/2020 FINDINGS: Osteopenia. Mild joint space loss of the hips medially. There is sclerosis and joint space loss of the sacroiliac joints, right-greater-than-left. Hypertrophic changes along the pubic symphysis. No acute fracture or dislocation. Hyperostosis. Scattered vascular calcifications. IMPRESSION: Osteopenia with degenerative changes. Electronically Signed   By: Karen Kays M.D.   On: 10/12/2022 15:28    Procedures Procedures    Medications Ordered in ED Medications  HYDROcodone-acetaminophen (NORCO/VICODIN) 5-325 MG per tablet 1 tablet (1 tablet Oral Given 10/12/22 1439)    ED Course/ Medical Decision Making/ A&P  Medical Decision Making Amount and/or Complexity of Data Reviewed Labs: ordered. Radiology: ordered.  Risk Prescription drug management.  77 year old well-appearing female presenting for left hip pain. Exam notable for limited active range of motion of the left hip with tenderness with external and internal rotation otherwise neurovascularly intact. DDx includes fracture, dislocation, septic arthritis, avascular necrosis of the left hip.  Discussed patient with Dale Kenner, PA with orthopedics.  He was able to discuss patient with Dr. Roda Shutters who advised CT of the left hip to rule out occult fracture.  If reassuring, patient can be discharged with Ortho follow-up, pain medication, and walker.  Will need to reach back out to  orthopedics if there is concern for fracture on CT scan.  Signed out patient to Fayrene Helper, PA.        Final Clinical Impression(s) / ED Diagnoses Final diagnoses:  Left hip pain    Rx / DC Orders ED Discharge Orders     None         Gareth Eagle, PA-C 10/12/22 1531    Gareth Eagle, PA-C 10/12/22 1556    Benjiman Core, MD 10/13/22 [phone]

## 2022-10-12 NOTE — Discharge Instructions (Addendum)
You have been evaluated for your left hip pain.  CT scan today did not show any broken bone or any dislocation.  You do have fluid in the left hip joint suggesting signs of inflammation about the left hip.  You may take opiate pain medication as needed for pain but follow-up closely with your orthopedic doctor for further care.  Be aware that opiate pain medication can cause constipation and drowsiness.  Happy belated birthday

## 2022-10-12 NOTE — ED Notes (Signed)
ED Provider at bedside. 

## 2022-10-12 NOTE — ED Triage Notes (Signed)
Pt c/o L hip pain x 6 months; increasing pain x 3-4 days, scheduled for hip replacement surgery 10/31; pt states she is  unable to ambulate now due to the pain; denies new injury

## 2022-10-20 ENCOUNTER — Ambulatory Visit (INDEPENDENT_AMBULATORY_CARE_PROVIDER_SITE_OTHER): Payer: Medicare Other | Admitting: Orthopaedic Surgery

## 2022-10-20 ENCOUNTER — Encounter: Payer: Self-pay | Admitting: Orthopaedic Surgery

## 2022-10-20 DIAGNOSIS — M1612 Unilateral primary osteoarthritis, left hip: Secondary | ICD-10-CM | POA: Diagnosis not present

## 2022-10-20 MED ORDER — HYDROCODONE-ACETAMINOPHEN 5-325 MG PO TABS: 1.0000 | ORAL_TABLET | Freq: Every evening | ORAL | 0 refills | Status: DC | PRN

## 2022-10-20 NOTE — Progress Notes (Signed)
   Office Visit Note   Patient: Angel Mason           Date of Birth: [birth year]           MRN: [id number] Visit Date: 10/20/2022              Requested by: Isabella Bowens, MD [address] [place],  [place] [address] PCP: Isabella Bowens, MD   Assessment & Plan: Visit Diagnoses:  1. Primary osteoarthritis of left hip     Plan: [name] is a 77 year old female with end-stage left hip DJD.  Sounds like she has been doing too much and having a lot of nighttime pain.  The hydrocodone helps her get through the night which I have refilled.  She is welcome to increase her Aleve dosage during the day and she has been asked to back off on activity.  Questions encouraged and answered.  Follow-Up Instructions: No follow-ups on file.   Orders:  No orders of the defined types were placed in this encounter.  No orders of the defined types were placed in this encounter.     Procedures: No procedures performed   Clinical Data: No additional findings.   Subjective: Chief Complaint  Patient presents with   Left Hip - Pain    HPI [name] follows up from the ER for recent left hip pain.  She is scheduled for total hip replacement towards the end of October.  CT scan was normal. Review of Systems   Objective: Vital Signs: There were no vitals taken for this visit.  Physical Exam  Ortho Exam Examination of left hip is unchanged.  Antalgic gait with a walker. Specialty Comments:  No specialty comments available.  Imaging: No results found.   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  History reviewed. No pertinent surgical  history. Social History   Occupational History   Not on file  Tobacco Use   Smoking status: Light Smoker   Smokeless tobacco: Never  Vaping Use   Vaping status: Never Used  Substance and Sexual Activity   Alcohol use: Yes    Comment: socially   Drug use: Never   Sexual activity: Not on file

## 2022-11-02 DIAGNOSIS — I4891 Unspecified atrial fibrillation: Secondary | ICD-10-CM

## 2022-11-02 HISTORY — DX: Unspecified atrial fibrillation: I48.91

## 2022-11-12 ENCOUNTER — Other Ambulatory Visit: Payer: Self-pay | Admitting: Physician Assistant

## 2022-11-12 ENCOUNTER — Telehealth: Payer: Self-pay | Admitting: Orthopaedic Surgery

## 2022-11-12 NOTE — Telephone Encounter (Signed)
Pt called requesting refill of hydrocodone. Please send to pharmacy on file. Pt phone number is [phone].

## 2022-11-12 NOTE — Telephone Encounter (Signed)
Too early to refill.

## 2022-11-12 NOTE — Telephone Encounter (Signed)
Notified patient that it was too early. She will call at the end of next week for a refill.

## 2022-11-18 ENCOUNTER — Other Ambulatory Visit: Payer: Self-pay | Admitting: Physician Assistant

## 2022-11-18 ENCOUNTER — Telehealth: Payer: Self-pay | Admitting: Orthopaedic Surgery

## 2022-11-18 MED ORDER — HYDROCODONE-ACETAMINOPHEN 5-325 MG PO TABS: 1.0000 | ORAL_TABLET | Freq: Every evening | ORAL | 0 refills | Status: DC | PRN

## 2022-11-18 NOTE — Telephone Encounter (Signed)
sent 

## 2022-11-18 NOTE — Telephone Encounter (Signed)
Pt called requesting refill of hydrocodone. Please send to [facility] on [facility] [place]. Pt phone number is [phone].

## 2022-11-26 NOTE — Pre-Procedure Instructions (Signed)
Surgical Instructions   Your procedure is scheduled on Thursday, October 31st. Report to [facility] Main Entrance "A" at 07:30 A.M., then check in with the Admitting office. Any questions or running late day of surgery: call [phone]  Questions prior to your surgery date: call [phone], Monday-Friday, 8am-4pm. If you experience any cold or flu symptoms such as cough, fever, chills, shortness of breath, etc. between now and your scheduled surgery, please notify us at the above number.     Remember:  Do not eat after midnight the night before your surgery  You may drink clear liquids until 07:00 AM the morning of your surgery.   Clear liquids allowed are: Water, Non-Citrus Juices (without pulp), Carbonated Beverages, Clear Tea, Black Coffee Only (NO MILK, CREAM OR POWDERED CREAMER of any kind), and Gatorade.  Patient Instructions  The night before surgery:  No food after midnight. ONLY clear liquids after midnight  The day of surgery (if you do NOT have diabetes):  Drink ONE (1) Pre-Surgery Clear Ensure by 07:00 AM the morning of surgery. Drink in one sitting. Do not sip.  This drink was given to you during your hospital  pre-op appointment visit.  Nothing else to drink after completing the  Pre-Surgery Clear Ensure.          If you have questions, please contact your surgeon's office.    Take these medicines the morning of surgery with A SIP OF WATER  atorvastatin (LIPITOR)  diltiazem (CARDIZEM CD)     One week prior to surgery, STOP taking any Aspirin (unless otherwise instructed by your surgeon) Aleve, Naproxen, Ibuprofen, Motrin, Advil, Goody's, BC's, all herbal medications, fish oil, and non-prescription vitamins.                     Do NOT Smoke (Tobacco/Vaping) for 24 hours prior to your procedure.  If you use a CPAP at night, you may bring your mask/headgear for your overnight stay.   You will be asked to remove any contacts, glasses, piercing's, hearing  aid's, dentures/partials prior to surgery. Please bring cases for these items if needed.    Patients discharged the day of surgery will not be allowed to drive home, and someone needs to stay with them for 24 hours.  SURGICAL WAITING ROOM VISITATION Patients may have no more than 2 support people in the waiting area - these visitors may rotate.   Pre-op nurse will coordinate an appropriate time for 1 ADULT support person, who may not rotate, to accompany patient in pre-op.  Children under the age of 53 must have an adult with them who is not the patient and must remain in the main waiting area with an adult.  If the patient needs to stay at the hospital during part of their recovery, the visitor guidelines for inpatient rooms apply.  Please refer to the [facility] website for the visitor guidelines for any additional information.   If you received a COVID test during your pre-op visit  it is requested that you wear a mask when out in public, stay away from anyone that may not be feeling well and notify your surgeon if you develop symptoms. If you have been in contact with anyone that has tested positive in the last 10 days please notify you surgeon.      Pre-operative 5 CHG Bathing Instructions   You can play a key role in reducing the risk of infection after surgery. Your skin needs to be as free  of germs as possible. You can reduce the number of germs on your skin by washing with CHG (chlorhexidine gluconate) soap before surgery. CHG is an antiseptic soap that kills germs and continues to kill germs even after washing.   DO NOT use if you have an allergy to chlorhexidine/CHG or antibacterial soaps. If your skin becomes reddened or irritated, stop using the CHG and notify one of our RNs at [phone].   Please shower with the CHG soap starting 4 days before surgery using the following schedule:     Please keep in mind the following:  DO NOT shave, including legs and underarms,  starting the day of your first shower.   You may shave your face at any point before/day of surgery.  Place clean sheets on your bed the day you start using CHG soap. Use a clean washcloth (not used since being washed) for each shower. DO NOT sleep with pets once you start using the CHG.   CHG Shower Instructions:  Wash your face and private area with normal soap. If you choose to wash your hair, wash first with your normal shampoo.  After you use shampoo/soap, rinse your hair and body thoroughly to remove shampoo/soap residue.  Turn the water OFF and apply about 3 tablespoons (45 ml) of CHG soap to a CLEAN washcloth.  Apply CHG soap ONLY FROM YOUR NECK DOWN TO YOUR TOES (washing for 3-5 minutes)  DO NOT use CHG soap on face, private areas, open wounds, or sores.  Pay special attention to the area where your surgery is being performed.  If you are having back surgery, having someone wash your back for you may be helpful. Wait 2 minutes after CHG soap is applied, then you may rinse off the CHG soap.  Pat dry with a clean towel  Put on clean clothes/pajamas   If you choose to wear lotion, please use ONLY the CHG-compatible lotions on the back of this paper.   Additional instructions for the day of surgery: DO NOT APPLY any lotions, deodorants, cologne, or perfumes.   Do not bring valuables to the hospital. [facility] is not responsible for any belongings/valuables. Do not wear nail polish, gel polish, artificial nails, or any other type of covering on natural nails (fingers and toes) Do not wear jewelry or makeup Put on clean/comfortable clothes.  Please brush your teeth.  Ask your nurse before applying any prescription medications to the skin.     CHG Compatible Lotions   Aveeno Moisturizing lotion  Cetaphil Moisturizing Cream  Cetaphil Moisturizing Lotion  Clairol Herbal Essence Moisturizing Lotion, Dry Skin  Clairol Herbal Essence Moisturizing Lotion, Extra Dry Skin  Clairol  Herbal Essence Moisturizing Lotion, Normal Skin  Curel Age Defying Therapeutic Moisturizing Lotion with Alpha Hydroxy  Curel Extreme Care Body Lotion  Curel Soothing Hands Moisturizing Hand Lotion  Curel Therapeutic Moisturizing Cream, Fragrance-Free  Curel Therapeutic Moisturizing Lotion, Fragrance-Free  Curel Therapeutic Moisturizing Lotion, Original Formula  Eucerin Daily Replenishing Lotion  Eucerin Dry Skin Therapy Plus Alpha Hydroxy Crme  Eucerin Dry Skin Therapy Plus Alpha Hydroxy Lotion  Eucerin Original Crme  Eucerin Original Lotion  Eucerin Plus Crme Eucerin Plus Lotion  Eucerin TriLipid Replenishing Lotion  Keri Anti-Bacterial Hand Lotion  Keri Deep Conditioning Original Lotion Dry Skin Formula Softly Scented  Keri Deep Conditioning Original Lotion, Fragrance Free Sensitive Skin Formula  Keri Lotion Fast Absorbing Fragrance Free Sensitive Skin Formula  Keri Lotion Fast Absorbing Softly Scented Dry Skin Formula  Keri Original  Lotion  [employer] Skin Renewal Lotion Keri Silky Smooth Lotion  Keri Silky Smooth Sensitive Skin Lotion  Nivea Body Creamy Conditioning Oil  Nivea Body Extra Enriched Teacher, adult education Moisturizing Lotion Nivea Crme  Nivea Skin Firming Lotion  NutraDerm 30 Skin Lotion  NutraDerm Skin Lotion  NutraDerm Therapeutic Skin Cream  NutraDerm Therapeutic Skin Lotion  ProShield Protective Hand Cream  Provon moisturizing lotion  Please read over the following fact sheets that you were given.

## 2022-11-29 ENCOUNTER — Encounter (HOSPITAL_COMMUNITY): Payer: Self-pay | Admitting: *Deleted

## 2022-11-29 ENCOUNTER — Encounter (HOSPITAL_COMMUNITY)
Admission: RE | Admit: 2022-11-29 | Discharge: 2022-11-29 | Disposition: A | Discharge status: Home / Self Care | Payer: Medicare Other | Source: Ambulatory Visit | Attending: Orthopaedic Surgery | Admitting: Orthopaedic Surgery

## 2022-11-29 ENCOUNTER — Other Ambulatory Visit: Payer: Self-pay

## 2022-11-29 VITALS — BP 139/81 | HR 76 | Temp 97.9°F | Resp 17 | Ht 65.0 in | Wt 180.0 lb

## 2022-11-29 DIAGNOSIS — Z01818 Encounter for other preprocedural examination: Secondary | ICD-10-CM

## 2022-11-29 DIAGNOSIS — M1612 Unilateral primary osteoarthritis, left hip: Secondary | ICD-10-CM | POA: Insufficient documentation

## 2022-11-29 DIAGNOSIS — Z01812 Encounter for preprocedural laboratory examination: Secondary | ICD-10-CM | POA: Diagnosis present

## 2022-11-29 HISTORY — DX: Unilateral primary osteoarthritis, left hip: M16.12

## 2022-11-29 HISTORY — DX: Supraventricular tachycardia, unspecified: I47.10

## 2022-11-29 LAB — CBC
HCT: 42.9 % (ref 36.0–46.0)
Hemoglobin: 13.5 g/dL (ref 12.0–15.0)
MCH: 28.5 pg (ref 26.0–34.0)
MCHC: 31.5 g/dL (ref 30.0–36.0)
MCV: 90.7 fL (ref 80.0–100.0)
Platelets: 317 10*3/uL (ref 150–400)
RBC: 4.73 MIL/uL (ref 3.87–5.11)
RDW: 14.2 % (ref 11.5–15.5)
WBC: 9.6 10*3/uL (ref 4.0–10.5)
nRBC: 0 % (ref 0.0–0.2)

## 2022-11-29 LAB — BASIC METABOLIC PANEL
Anion gap: 10 (ref 5–15)
BUN: 12 mg/dL (ref 8–23)
CO2: 25 mmol/L (ref 22–32)
Calcium: 9.6 mg/dL (ref 8.9–10.3)
Chloride: 104 mmol/L (ref 98–111)
Creatinine, Ser: 0.54 mg/dL (ref 0.44–1.00)
GFR, Estimated: >60 mL/min (ref >60)
Glucose, Bld: 97 mg/dL (ref 70–99)
Potassium: 3.9 mmol/L (ref 3.5–5.1)
Sodium: 139 mmol/L (ref 135–145)

## 2022-11-29 LAB — TYPE AND SCREEN
ABO/RH(D): O POS
Antibody Screen: NEGATIVE

## 2022-11-29 LAB — SURGICAL PCR SCREEN
MRSA, PCR: NEGATIVE
Staphylococcus aureus: NEGATIVE

## 2022-11-29 NOTE — Progress Notes (Signed)
PCP - Dr. Amparo Bristol Vasireddy Cardiologist - Dr. Yehuda Budd- records requested  PPM/ICD - denies   Chest x-ray - pt states she had one about 3-4 years ago, somewhere in [place], normal per pt EKG - September 2024, tracing requested Stress Test - denies ECHO - 11/2021 Cardiac Cath - denies  Sleep Study - denies   DM- denies   ASA/Blood Thinner Instructions: n/a   ERAS Protcol - yes PRE-SURGERY Ensure given at PAT  COVID TEST- n/a   Anesthesia review: yes, cardiac hx. Records requested. Per cardiology note, pt was to be started on Xarelto. Pt states that Dr. Teofilo Pod never mentioned anything about any blood thinners and she has not taken any. Revonda Standard aware of this pt  Patient denies shortness of breath, fever, cough and chest pain at PAT appointment   All instructions explained to the patient, with a verbal understanding of the material. Patient agrees to go over the instructions while at home for a better understanding.  The opportunity to ask questions was provided.

## 2022-12-01 ENCOUNTER — Other Ambulatory Visit: Payer: Self-pay | Admitting: Physician Assistant

## 2022-12-01 ENCOUNTER — Encounter (HOSPITAL_COMMUNITY): Payer: Self-pay

## 2022-12-01 ENCOUNTER — Telehealth: Payer: Self-pay | Admitting: Orthopaedic Surgery

## 2022-12-01 MED ORDER — METHOCARBAMOL 750 MG PO TABS: 750.0000 mg | ORAL_TABLET | Freq: Two times a day (BID) | ORAL | 2 refills | Status: AC | PRN

## 2022-12-01 MED ORDER — OXYCODONE-ACETAMINOPHEN 5-325 MG PO TABS
1.0000 | ORAL_TABLET | Freq: Three times a day (TID) | ORAL | 0 refills | Status: AC | PRN
Start: 2022-12-01 — End: ?

## 2022-12-01 MED ORDER — ONDANSETRON HCL 4 MG PO TABS: 4.0000 mg | ORAL_TABLET | Freq: Three times a day (TID) | ORAL | 0 refills | Status: AC | PRN

## 2022-12-01 MED ORDER — DOCUSATE SODIUM 100 MG PO CAPS: 100.0000 mg | ORAL_CAPSULE | Freq: Every day | ORAL | 2 refills | Status: AC | PRN

## 2022-12-01 MED ORDER — ASPIRIN 81 MG PO TBEC: 81.0000 mg | DELAYED_RELEASE_TABLET | Freq: Two times a day (BID) | ORAL | 0 refills | Status: DC

## 2022-12-01 MED ORDER — RIVAROXABAN 10 MG PO TABS: 10.0000 mg | ORAL_TABLET | Freq: Every day | ORAL | 0 refills | Status: DC

## 2022-12-01 NOTE — Telephone Encounter (Signed)
Angel Mason, not sure what we should do about this.  I sent this in after reading allison's message. Evidently she should have been taking xarelto per cardiology due to a-fibb, but has not been taking.  Was going to start her on xarelto po for this reason.  What are your thoughts since so expensive?

## 2022-12-01 NOTE — Telephone Encounter (Signed)
Would lovenox or eliquis be cheaper?

## 2022-12-01 NOTE — Progress Notes (Addendum)
Anesthesia Chart Review:  Case: [id number] Date/Time: 12/09/22 0940   Procedure: TOTAL HIP ARTHROPLASTY ANTERIOR APPROACH (Left: Hip) - 3-C   Anesthesia type: Spinal   Pre-op diagnosis: left hip osteoarthrits   Location: [facility] OR ROOM 07 / [facility] OR   Surgeons: Tarry Kos, MD       DISCUSSION: Patient is a 77 year old female scheduled for the above procedure.  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 Drinks 1 glass of wine daily.   PCP Vasireddy, Daneen Schick, MD classified her as "low" risk for planned procedure from a medical standpoing.  Last visit with [facility] cardiologist Dr. Yehuda Budd was on 11/02/22 for HTN, HLD, venous insufficiency, pSVT follow-up. She was in rate controlled afib on unknown duration. 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 Unremarkable echo in 11/2021. She was asymptomatic with "moderately good exercise tolerance." Rate control strategy of afib planned. No additional testing recommendations. In regards to preoperative cardiac evaluation, he wrote, "This patient is seeing an orthopedic surgeon in [place] and will require left hip replacement surgery in the near future. This patient has excellent exercise tolerance and no symptoms of angina. This patient had an echocardiogram performed in 11/2021 which revealed normal left ventricular function, EF 60-65%, normal RV function, and no significant valvular disease. I feel this patient is low risk from a cardiac standpoint to proceed with this surgery. This patient may stop her Xarelto 48 hours prior to the procedure and resume it 24 hours after the procedure." Patient reported she does not recall blood thinners being discussed, so she has never started.   Awaiting [facility] records, but in the interim I did call and speak with Ms. Weinmann. She confirmed she was aware of afib  diagnosis. I believe she thinks of afib and SVT as one in the same. I discussed some differences in management including increased risk of CVA with afib, so often anticoagulation therapy is recommended to reduce risk. Based on her known history her CHA2DS2-VASc score was 53 (age 47, female). She is also a smoker. With upcoming surgery, she was not inclined to start, but I did advise she communicate with Dr. Frances Furbish office to readdress risks and benefits of long term anticoagulation. She remains asymptomatic on diltiazem. I communicated this with Dr. Roda Shutters to consider when determining post-operative DVT prophylaxis recommendations. I also updated anesthesiologist Eilene Ghazi, MD.   Anesthesia team to evaluate on the day of surgery. (UPDATE 12/06/22 10:40 AM: Last office note, echo, and EKG received from [facility]. Dr. Roda Shutters is considering post-operative Eliquis or Lovenox for DVT prophylaxis.)   VS: BP 139/81   Pulse 76   Temp 36.6 C   Resp 17   Ht 5\' 5"  (1.651 m)   Wt 81.6 kg   SpO2 98%   BMI 29.95 kg/m    PROVIDERS: Vasireddy, Daneen Schick, MD is PCP  Yehuda Budd, MD is cardiologist [facility])   LABS: Labs reviewed: Acceptable for surgery. (all labs ordered are listed, but only abnormal results are displayed)  Labs Reviewed  SURGICAL PCR SCREEN  CBC  BASIC METABOLIC PANEL  TYPE AND SCREEN    IMAGES: CT Left Hip 10/12/22: IMPRESSION: 1. No acute fracture or dislocation. 2. Degenerative arthritis left hip and moderate left hip joint effusion.   EKG:  EKG 11/02/22 ([facility]): Requested tracing. Per office note in [facility] by Dr. Teofilo Pod, "  EKG 11/02/2022: Atrial fibrillation at 65 bpm. Normal axis. Nonspecific T wave abnormality." Copy on shadow chart.    CV: Echo requested. Per 11/02/22 office note in [facility] by Dr. Teofilo Pod, "11/2021 which revealed normal left ventricular function, EF 60-65%, normal RV function, and no significant valvular disease."  UPDATE: Report of  echo received: Echo 11/09/21 ([facility]): Conclusions: 1.  Left ventricle: The cavity size is normal.  Wall thickness is normal.  The estimated ejection fraction is 60 to 65%.  Segmental wall motion is normal.  Grade 1 diastolic dysfunction. 2.  Aortic valve: There is mild thickening, consistent with sclerosis. 3.  Mitral valve: There is mild regurgitation. 4.  Right ventricle: The cavity size is normal.  Systolic function is normal. 5.  Pulmonic valve: There is trace regurgitation. 6.  Pulmonary arteries: There is no evidence of pulmonary hypertension. 7.  Tricuspid valve: There is mild regurgitation.   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 chest   Osteoarthritis of left hip    SVT (supraventricular tachycardia) (HCC)     Past Surgical History:  Procedure Laterality Date   ABDOMINAL HYSTERECTOMY     INCISION AND DRAINAGE BREAST ABSCESS Left    TONSILLECTOMY     removed at 77 yo   VAGINAL DELIVERY     x2    MEDICATIONS:  aspirin EC 81 MG tablet   atorvastatin (LIPITOR) 10 MG tablet   Cholecalciferol (VITAMIN D) 50 MCG (2000 UT) tablet   diltiazem (CARDIZEM CD) 180 MG 24 hr capsule   docusate sodium (COLACE) 100 MG capsule   HYDROcodone-acetaminophen (NORCO) 5-325 MG tablet   methocarbamol (ROBAXIN-750) 750 MG tablet   Multiple Vitamins-Minerals (PRESERVISION AREDS 2) CAPS   naproxen sodium (ALEVE) 220 MG tablet   ondansetron (ZOFRAN) 4 MG tablet   oxyCODONE-acetaminophen (PERCOCET) 5-325 MG tablet   ustekinumab (STELARA) 45 MG/0.5ML SOSY injection   No current facility-administered medications for this encounter.    Shonna Chock, PA-C Surgical Short Stay/Anesthesiology [facility] Phone [phone] [facility] Phone [phone] 12/01/2022 3:38 PM

## 2022-12-01 NOTE — Telephone Encounter (Signed)
Pt called in stating the Medication Dr. Roda Shutters called in is $500 and she cannot afford to get it filled can he send in something else than Xarelto. Please advise

## 2022-12-01 NOTE — Anesthesia Preprocedure Evaluation (Addendum)
Anesthesia Evaluation  Patient identified by MRN, date of birth, ID band Patient awake    Reviewed: Allergy & Precautions, NPO status , Patient's Chart, lab work & pertinent test results  Airway Mallampati: II  TM Distance: <3 FB     Dental  (+) Caps, Dental Advisory Given, Edentulous Upper   Pulmonary Current Smoker and Patient abstained from smoking.   Pulmonary exam normal breath sounds clear to auscultation       Cardiovascular Normal cardiovascular exam+ dysrhythmias Atrial Fibrillation and Supra Ventricular Tachycardia  Rhythm:Regular Rate:Normal     Neuro/Psych negative neurological ROS  negative psych ROS   GI/Hepatic negative GI ROS, Neg liver ROS,,,  Endo/Other  negative endocrine ROS  Hyperlipidemia  Renal/GU negative Renal ROS  negative genitourinary   Musculoskeletal  (+) Arthritis , Osteoarthritis,  Left hip OA   Abdominal   Peds  Hematology negative hematology ROS (+) Xarelto therapy-last dose 10/23 Lovenox therapy- Last dose   Anesthesia Other Findings   Reproductive/Obstetrics                             Anesthesia Physical Anesthesia Plan  ASA: 3  Anesthesia Plan: Spinal   Post-op Pain Management: Minimal or no pain anticipated   Induction: Intravenous  PONV Risk Score and Plan: 2 and Treatment may vary due to age or medical condition, Propofol infusion and Ondansetron  Airway Management Planned: Natural Airway and Simple Face Mask  Additional Equipment: None  Intra-op Plan:   Post-operative Plan:   Informed Consent: I have reviewed the patients History and Physical, chart, labs and discussed the procedure including the risks, benefits and alternatives for the proposed anesthesia with the patient or authorized representative who has indicated his/her understanding and acceptance.     Dental advisory given  Plan Discussed with: CRNA and  Anesthesiologist  Anesthesia Plan Comments: (PAT note written 12/01/2022 by Shonna Chock, PA-C.  )       Anesthesia Quick Evaluation

## 2022-12-02 ENCOUNTER — Telehealth: Payer: Self-pay | Admitting: Orthopaedic Surgery

## 2022-12-02 ENCOUNTER — Other Ambulatory Visit: Payer: Self-pay | Admitting: Physician Assistant

## 2022-12-02 MED ORDER — ENOXAPARIN SODIUM 40 MG/0.4ML IJ SOSY: 40.0000 mg | PREFILLED_SYRINGE | INTRAMUSCULAR | 0 refills | Status: AC

## 2022-12-02 NOTE — Telephone Encounter (Signed)
Notified patient that something else was sent in today to her pharmacy. Would Eliquis be an option if this is still too much?

## 2022-12-02 NOTE — Telephone Encounter (Signed)
Yeah that should be fine

## 2022-12-02 NOTE — Telephone Encounter (Signed)
Eliquis was over $500 as well.  Lovenox is unknown, but says it is covered by her insurance.  Did 3 weeks of 40 daily.  We can switch to aspirin after that if ok with you?

## 2022-12-02 NOTE — Telephone Encounter (Signed)
Patient called and said that Dr. Roda Shutters sent in some medication for her but she can't afford to pay $500 for a few pills and is there anything else he can call in that's affordable.[phone]

## 2022-12-03 NOTE — Telephone Encounter (Signed)
Eliquis was going to be over $500 as well.  We sent in lovenox for three weeks as it will likely be less expensive

## 2022-12-03 NOTE — Telephone Encounter (Signed)
Patient will let me know if the medication is too expensive.

## 2022-12-06 ENCOUNTER — Other Ambulatory Visit: Payer: Self-pay | Admitting: Physician Assistant

## 2022-12-08 MED ORDER — TRANEXAMIC ACID 1000 MG/10ML IV SOLN
2000.0000 mg | INTRAVENOUS | Status: DC
  Filled 2022-12-08: qty 20

## 2022-12-08 NOTE — Progress Notes (Signed)
Pt made aware of another surgery time change for 10/31 0915-1109, arrival 0645. Finish the pre-surgery drink by 6440.

## 2022-12-08 NOTE — Progress Notes (Signed)
Pt made aware of surgery time change for 10/31 1125-1319, arrival 0900. Finish the pre-surgery drink by 0830, and to follow all previous instructions.

## 2022-12-09 ENCOUNTER — Encounter (HOSPITAL_COMMUNITY)
Admission: RE | Disposition: A | Discharge status: Home / Self Care | Payer: Self-pay | Source: Home / Self Care | Attending: Orthopaedic Surgery

## 2022-12-09 ENCOUNTER — Other Ambulatory Visit: Payer: Self-pay

## 2022-12-09 ENCOUNTER — Ambulatory Visit (HOSPITAL_COMMUNITY): Payer: Medicare Other | Admitting: Vascular Surgery

## 2022-12-09 ENCOUNTER — Encounter (HOSPITAL_COMMUNITY): Payer: Self-pay | Admitting: Orthopaedic Surgery

## 2022-12-09 ENCOUNTER — Ambulatory Visit (HOSPITAL_COMMUNITY): Payer: Self-pay | Admitting: Certified Registered"

## 2022-12-09 ENCOUNTER — Observation Stay (HOSPITAL_COMMUNITY): Payer: Medicare Other

## 2022-12-09 ENCOUNTER — Ambulatory Visit (HOSPITAL_COMMUNITY): Payer: Medicare Other

## 2022-12-09 ENCOUNTER — Observation Stay (HOSPITAL_COMMUNITY)
Admission: RE | Admit: 2022-12-09 | Discharge: 2022-12-10 | Disposition: A | Discharge status: Home / Self Care | Payer: Medicare Other | Attending: Orthopaedic Surgery | Admitting: Orthopaedic Surgery

## 2022-12-09 DIAGNOSIS — I4891 Unspecified atrial fibrillation: Secondary | ICD-10-CM | POA: Insufficient documentation

## 2022-12-09 DIAGNOSIS — F17210 Nicotine dependence, cigarettes, uncomplicated: Secondary | ICD-10-CM | POA: Diagnosis not present

## 2022-12-09 DIAGNOSIS — M1612 Unilateral primary osteoarthritis, left hip: Principal | ICD-10-CM | POA: Diagnosis present

## 2022-12-09 DIAGNOSIS — F1090 Alcohol use, unspecified, uncomplicated: Secondary | ICD-10-CM | POA: Diagnosis not present

## 2022-12-09 DIAGNOSIS — Z96642 Presence of left artificial hip joint: Secondary | ICD-10-CM

## 2022-12-09 DIAGNOSIS — F17200 Nicotine dependence, unspecified, uncomplicated: Secondary | ICD-10-CM | POA: Diagnosis not present

## 2022-12-09 HISTORY — PX: TOTAL HIP ARTHROPLASTY: SHX124

## 2022-12-09 LAB — ABO/RH: ABO/RH(D): O POS

## 2022-12-09 SURGERY — ARTHROPLASTY, HIP, TOTAL, ANTERIOR APPROACH
Anesthesia: Spinal | Site: Hip | Laterality: Left

## 2022-12-09 MED ORDER — ROCURONIUM BROMIDE 10 MG/ML (PF) SYRINGE
PREFILLED_SYRINGE | INTRAVENOUS | Status: AC
  Filled 2022-12-09: qty 10

## 2022-12-09 MED ORDER — HYDROXYZINE HCL 50 MG/ML IM SOLN
50.0000 mg | Freq: Four times a day (QID) | INTRAMUSCULAR | Status: DC | PRN
  Administered 2022-12-09: 50 mg via INTRAMUSCULAR
  Filled 2022-12-09: qty 1

## 2022-12-09 MED ORDER — PANTOPRAZOLE SODIUM 40 MG PO TBEC
40.0000 mg | DELAYED_RELEASE_TABLET | Freq: Every day | ORAL | Status: DC
  Administered 2022-12-10: 40 mg via ORAL
  Filled 2022-12-09: qty 1

## 2022-12-09 MED ORDER — ACETAMINOPHEN 500 MG PO TABS
1000.0000 mg | ORAL_TABLET | Freq: Four times a day (QID) | ORAL | Status: AC
  Administered 2022-12-09 – 2022-12-10 (×3): 1000 mg via ORAL
  Filled 2022-12-09 (×3): qty 2

## 2022-12-09 MED ORDER — MIDAZOLAM HCL 2 MG/2ML IJ SOLN
INTRAMUSCULAR | Status: AC
  Filled 2022-12-09: qty 2

## 2022-12-09 MED ORDER — OXYCODONE HCL 5 MG/5ML PO SOLN: 5.0000 mg | Freq: Once | ORAL | Status: DC | PRN

## 2022-12-09 MED ORDER — ACETAMINOPHEN 325 MG PO TABS: 325.0000 mg | ORAL_TABLET | Freq: Four times a day (QID) | ORAL | Status: DC | PRN

## 2022-12-09 MED ORDER — BUPIVACAINE IN DEXTROSE 0.75-8.25 % IT SOLN
INTRATHECAL | Status: DC | PRN
  Administered 2022-12-09: 1.7 mL via INTRATHECAL

## 2022-12-09 MED ORDER — FENTANYL CITRATE (PF) 250 MCG/5ML IJ SOLN
INTRAMUSCULAR | Status: DC | PRN
  Administered 2022-12-09 (×3): 50 ug via INTRAVENOUS

## 2022-12-09 MED ORDER — TRANEXAMIC ACID-NACL 1000-0.7 MG/100ML-% IV SOLN
1000.0000 mg | INTRAVENOUS | Status: AC
  Administered 2022-12-09: 1000 mg via INTRAVENOUS
  Filled 2022-12-09: qty 100

## 2022-12-09 MED ORDER — FERROUS SULFATE 325 (65 FE) MG PO TABS
325.0000 mg | ORAL_TABLET | Freq: Three times a day (TID) | ORAL | Status: DC
  Administered 2022-12-09 – 2022-12-10 (×3): 325 mg via ORAL
  Filled 2022-12-09 (×3): qty 1

## 2022-12-09 MED ORDER — MIDAZOLAM HCL 2 MG/2ML IJ SOLN
INTRAMUSCULAR | Status: DC | PRN
  Administered 2022-12-09 (×2): 1 mg via INTRAVENOUS

## 2022-12-09 MED ORDER — EPHEDRINE 5 MG/ML INJ
INTRAVENOUS | Status: AC
  Filled 2022-12-09: qty 5

## 2022-12-09 MED ORDER — PROPOFOL 500 MG/50ML IV EMUL
INTRAVENOUS | Status: DC | PRN
  Administered 2022-12-09: 50 ug/kg/min via INTRAVENOUS

## 2022-12-09 MED ORDER — SORBITOL 70 % SOLN
30.0000 mL | Freq: Every day | Status: DC | PRN
Start: 2022-12-09 — End: 2022-12-10

## 2022-12-09 MED ORDER — PROPOFOL 10 MG/ML IV BOLUS
INTRAVENOUS | Status: DC | PRN
  Administered 2022-12-09: 40 mg via INTRAVENOUS

## 2022-12-09 MED ORDER — HYDROMORPHONE HCL 1 MG/ML IJ SOLN
0.5000 mg | INTRAMUSCULAR | Status: DC | PRN
  Administered 2022-12-09: 0.5 mg via INTRAVENOUS
  Filled 2022-12-09: qty 1

## 2022-12-09 MED ORDER — CEFAZOLIN SODIUM-DEXTROSE 2-4 GM/100ML-% IV SOLN
2.0000 g | INTRAVENOUS | Status: AC
  Administered 2022-12-09: 2 g via INTRAVENOUS
  Filled 2022-12-09: qty 100

## 2022-12-09 MED ORDER — ORAL CARE MOUTH RINSE: 15.0000 mL | Freq: Once | OROMUCOSAL | Status: AC

## 2022-12-09 MED ORDER — DILTIAZEM HCL ER COATED BEADS 180 MG PO CP24
180.0000 mg | ORAL_CAPSULE | Freq: Every day | ORAL | Status: DC
  Administered 2022-12-10: 180 mg via ORAL
  Filled 2022-12-09 (×2): qty 1

## 2022-12-09 MED ORDER — PHENOL 1.4 % MT LIQD: 1.0000 | OROMUCOSAL | Status: DC | PRN

## 2022-12-09 MED ORDER — PHENYLEPHRINE 80 MCG/ML (10ML) SYRINGE FOR IV PUSH (FOR BLOOD PRESSURE SUPPORT)
PREFILLED_SYRINGE | INTRAVENOUS | Status: DC | PRN
  Administered 2022-12-09 (×3): 160 ug via INTRAVENOUS

## 2022-12-09 MED ORDER — OXYCODONE HCL 5 MG PO TABS
10.0000 mg | ORAL_TABLET | ORAL | Status: DC | PRN
  Administered 2022-12-10: 10 mg via ORAL
  Filled 2022-12-09 (×2): qty 2

## 2022-12-09 MED ORDER — ENOXAPARIN SODIUM 40 MG/0.4ML IJ SOSY
40.0000 mg | PREFILLED_SYRINGE | INTRAMUSCULAR | Status: DC
  Administered 2022-12-09: 40 mg via SUBCUTANEOUS
  Filled 2022-12-09: qty 0.4

## 2022-12-09 MED ORDER — POLYETHYLENE GLYCOL 3350 17 G PO PACK: 17.0000 g | PACK | Freq: Every day | ORAL | Status: DC

## 2022-12-09 MED ORDER — ONDANSETRON HCL 4 MG/2ML IJ SOLN
INTRAMUSCULAR | Status: DC | PRN
  Administered 2022-12-09: 4 mg via INTRAVENOUS

## 2022-12-09 MED ORDER — CEFAZOLIN SODIUM-DEXTROSE 2-4 GM/100ML-% IV SOLN
2.0000 g | Freq: Four times a day (QID) | INTRAVENOUS | Status: AC
Start: 2022-12-09 — End: 2022-12-10
  Administered 2022-12-09 (×2): 2 g via INTRAVENOUS
  Filled 2022-12-09 (×2): qty 100

## 2022-12-09 MED ORDER — PROPOFOL 10 MG/ML IV BOLUS
INTRAVENOUS | Status: AC
  Filled 2022-12-09: qty 20

## 2022-12-09 MED ORDER — TRANEXAMIC ACID 1000 MG/10ML IV SOLN
INTRAVENOUS | Status: DC | PRN
  Administered 2022-12-09: 2000 mg via TOPICAL

## 2022-12-09 MED ORDER — BUPIVACAINE-MELOXICAM ER 400-12 MG/14ML IJ SOLN
INTRAMUSCULAR | Status: DC | PRN
  Administered 2022-12-09: 400 mg

## 2022-12-09 MED ORDER — EPHEDRINE SULFATE (PRESSORS) 50 MG/ML IJ SOLN
INTRAMUSCULAR | Status: DC | PRN
  Administered 2022-12-09 (×2): 10 mg via INTRAVENOUS

## 2022-12-09 MED ORDER — SODIUM CHLORIDE 0.9 % IR SOLN
Status: DC | PRN
  Administered 2022-12-09: 3000 mL

## 2022-12-09 MED ORDER — ALUM & MAG HYDROXIDE-SIMETH 200-200-20 MG/5ML PO SUSP: 30.0000 mL | ORAL | Status: DC | PRN

## 2022-12-09 MED ORDER — DEXAMETHASONE SODIUM PHOSPHATE 10 MG/ML IJ SOLN
INTRAMUSCULAR | Status: AC
  Filled 2022-12-09: qty 1

## 2022-12-09 MED ORDER — DIPHENHYDRAMINE HCL 12.5 MG/5ML PO ELIX: 25.0000 mg | ORAL_SOLUTION | ORAL | Status: DC | PRN

## 2022-12-09 MED ORDER — MAGNESIUM CITRATE PO SOLN: 1.0000 | Freq: Once | ORAL | Status: DC | PRN

## 2022-12-09 MED ORDER — DEXAMETHASONE SODIUM PHOSPHATE 10 MG/ML IJ SOLN
INTRAMUSCULAR | Status: DC | PRN
  Administered 2022-12-09: 5 mg via INTRAVENOUS

## 2022-12-09 MED ORDER — TRANEXAMIC ACID-NACL 1000-0.7 MG/100ML-% IV SOLN
1000.0000 mg | Freq: Once | INTRAVENOUS | Status: AC
  Administered 2022-12-09: 1000 mg via INTRAVENOUS
  Filled 2022-12-09: qty 100

## 2022-12-09 MED ORDER — MENTHOL 3 MG MT LOZG: 1.0000 | LOZENGE | OROMUCOSAL | Status: DC | PRN

## 2022-12-09 MED ORDER — CHLORHEXIDINE GLUCONATE 0.12 % MT SOLN
15.0000 mL | Freq: Once | OROMUCOSAL | Status: AC
Start: 2022-12-09 — End: 2022-12-09
  Administered 2022-12-09: 15 mL via OROMUCOSAL
  Filled 2022-12-09: qty 15

## 2022-12-09 MED ORDER — FENTANYL CITRATE (PF) 250 MCG/5ML IJ SOLN
INTRAMUSCULAR | Status: AC
  Filled 2022-12-09: qty 5

## 2022-12-09 MED ORDER — POLYETHYLENE GLYCOL 3350 17 G PO PACK
17.0000 g | PACK | Freq: Every day | ORAL | Status: DC
Start: 2022-12-09 — End: 2022-12-10
  Administered 2022-12-09: 17 g via ORAL
  Filled 2022-12-09: qty 1

## 2022-12-09 MED ORDER — METHOCARBAMOL 1000 MG/10ML IJ SOLN: 500.0000 mg | Freq: Four times a day (QID) | INTRAMUSCULAR | Status: DC | PRN

## 2022-12-09 MED ORDER — METHOCARBAMOL 500 MG PO TABS
500.0000 mg | ORAL_TABLET | Freq: Four times a day (QID) | ORAL | Status: DC | PRN
  Administered 2022-12-10: 500 mg via ORAL
  Filled 2022-12-09 (×2): qty 1

## 2022-12-09 MED ORDER — METOCLOPRAMIDE HCL 5 MG/ML IJ SOLN
5.0000 mg | Freq: Three times a day (TID) | INTRAMUSCULAR | Status: DC | PRN
Start: 2022-12-09 — End: 2022-12-10

## 2022-12-09 MED ORDER — LIDOCAINE 2% (20 MG/ML) 5 ML SYRINGE
INTRAMUSCULAR | Status: AC
  Filled 2022-12-09: qty 5

## 2022-12-09 MED ORDER — AMISULPRIDE (ANTIEMETIC) 5 MG/2ML IV SOLN: 10.0000 mg | Freq: Once | INTRAVENOUS | Status: DC | PRN

## 2022-12-09 MED ORDER — OXYCODONE HCL 5 MG PO TABS
5.0000 mg | ORAL_TABLET | ORAL | Status: DC | PRN
  Administered 2022-12-09: 5 mg via ORAL
  Administered 2022-12-10: 10 mg via ORAL
  Filled 2022-12-09: qty 2

## 2022-12-09 MED ORDER — METOCLOPRAMIDE HCL 5 MG PO TABS
5.0000 mg | ORAL_TABLET | Freq: Three times a day (TID) | ORAL | Status: DC | PRN
Start: 2022-12-09 — End: 2022-12-10

## 2022-12-09 MED ORDER — PHENYLEPHRINE 80 MCG/ML (10ML) SYRINGE FOR IV PUSH (FOR BLOOD PRESSURE SUPPORT)
PREFILLED_SYRINGE | INTRAVENOUS | Status: AC
  Filled 2022-12-09: qty 10

## 2022-12-09 MED ORDER — DEXAMETHASONE SODIUM PHOSPHATE 10 MG/ML IJ SOLN: 10.0000 mg | Freq: Once | INTRAMUSCULAR | Status: DC

## 2022-12-09 MED ORDER — VANCOMYCIN HCL 1 G IV SOLR
INTRAVENOUS | Status: DC | PRN
  Administered 2022-12-09: 1000 mg via TOPICAL

## 2022-12-09 MED ORDER — ONDANSETRON HCL 4 MG PO TABS: 4.0000 mg | ORAL_TABLET | Freq: Four times a day (QID) | ORAL | Status: DC | PRN

## 2022-12-09 MED ORDER — LACTATED RINGERS IV SOLN: INTRAVENOUS | Status: DC

## 2022-12-09 MED ORDER — ONDANSETRON HCL 4 MG/2ML IJ SOLN: 4.0000 mg | Freq: Once | INTRAMUSCULAR | Status: DC | PRN

## 2022-12-09 MED ORDER — BUPIVACAINE-MELOXICAM ER 400-12 MG/14ML IJ SOLN
INTRAMUSCULAR | Status: AC
  Filled 2022-12-09: qty 1

## 2022-12-09 MED ORDER — HYDROMORPHONE HCL 1 MG/ML IJ SOLN: 0.2500 mg | INTRAMUSCULAR | Status: DC | PRN

## 2022-12-09 MED ORDER — PRONTOSAN WOUND IRRIGATION OPTIME
TOPICAL | Status: DC | PRN
  Administered 2022-12-09: 50 mL via TOPICAL

## 2022-12-09 MED ORDER — ONDANSETRON HCL 4 MG/2ML IJ SOLN
INTRAMUSCULAR | Status: AC
  Filled 2022-12-09: qty 2

## 2022-12-09 MED ORDER — LIDOCAINE 2% (20 MG/ML) 5 ML SYRINGE
INTRAMUSCULAR | Status: DC | PRN
  Administered 2022-12-09: 60 mg via INTRAVENOUS

## 2022-12-09 MED ORDER — DEXAMETHASONE SODIUM PHOSPHATE 10 MG/ML IJ SOLN
10.0000 mg | Freq: Once | INTRAMUSCULAR | Status: AC
  Administered 2022-12-10: 10 mg via INTRAVENOUS
  Filled 2022-12-09: qty 1

## 2022-12-09 MED ORDER — OXYCODONE HCL 5 MG PO TABS: 5.0000 mg | ORAL_TABLET | Freq: Once | ORAL | Status: DC | PRN

## 2022-12-09 MED ORDER — POVIDONE-IODINE 10 % EX SWAB
2.0000 | Freq: Once | CUTANEOUS | Status: AC
  Administered 2022-12-09: 2 via TOPICAL

## 2022-12-09 MED ORDER — DOCUSATE SODIUM 100 MG PO CAPS
100.0000 mg | ORAL_CAPSULE | Freq: Two times a day (BID) | ORAL | Status: DC
  Administered 2022-12-09 – 2022-12-10 (×2): 100 mg via ORAL
  Filled 2022-12-09 (×2): qty 1

## 2022-12-09 MED ORDER — PHENYLEPHRINE HCL-NACL 20-0.9 MG/250ML-% IV SOLN
INTRAVENOUS | Status: DC | PRN
  Administered 2022-12-09: 50 ug/min via INTRAVENOUS

## 2022-12-09 MED ORDER — ALBUMIN HUMAN 5 % IV SOLN: INTRAVENOUS | Status: DC | PRN

## 2022-12-09 MED ORDER — ONDANSETRON HCL 4 MG/2ML IJ SOLN
4.0000 mg | Freq: Four times a day (QID) | INTRAMUSCULAR | Status: DC | PRN
  Administered 2022-12-09: 4 mg via INTRAVENOUS
  Filled 2022-12-09: qty 2

## 2022-12-09 SURGICAL SUPPLY — 65 items
AGENT HMST PWDR BTL CLGN 5GM (Miscellaneous) ×1 IMPLANT
BAG COUNTER SPONGE SURGICOUNT (BAG) ×1 IMPLANT
BAG DECANTER FOR FLEXI CONT (MISCELLANEOUS) ×1 IMPLANT
BAG SPNG CNTER NS LX DISP (BAG) ×1
BLADE SAG 18X100X1.27 (BLADE) ×1 IMPLANT
COLLAGEN CELLERATERX 5 GRAM (Miscellaneous) IMPLANT
COVER PERINEAL POST (MISCELLANEOUS) ×1 IMPLANT
COVER SURGICAL LIGHT HANDLE (MISCELLANEOUS) ×1 IMPLANT
CUP SECTOR GRIPTON 50MM (Cup) IMPLANT
DRAPE C-ARM 42X72 X-RAY (DRAPES) ×1 IMPLANT
DRAPE POUCH INSTRU U-SHP 10X18 (DRAPES) ×1 IMPLANT
DRAPE STERI IOBAN 125X83 (DRAPES) ×1 IMPLANT
DRAPE U-SHAPE 47X51 STRL (DRAPES) ×2 IMPLANT
DRSG AQUACEL AG ADV 3.5X10 (GAUZE/BANDAGES/DRESSINGS) ×1 IMPLANT
DURAPREP 26ML APPLICATOR (WOUND CARE) ×2 IMPLANT
ELECT BLADE 4.0 EZ CLEAN MEGAD (MISCELLANEOUS) ×1
ELECT REM PT RETURN 9FT ADLT (ELECTROSURGICAL) ×1
ELECTRODE BLDE 4.0 EZ CLN MEGD (MISCELLANEOUS) ×1 IMPLANT
ELECTRODE REM PT RTRN 9FT ADLT (ELECTROSURGICAL) ×1 IMPLANT
GLOVE BIOGEL PI IND STRL 7.0 (GLOVE) ×2 IMPLANT
GLOVE BIOGEL PI IND STRL 7.5 (GLOVE) ×5 IMPLANT
GLOVE ECLIPSE 7.0 STRL STRAW (GLOVE) ×2 IMPLANT
GLOVE SKINSENSE STRL SZ7.5 (GLOVE) ×1 IMPLANT
GLOVE SURG SYN 7.5 E (GLOVE) ×2 IMPLANT
GLOVE SURG SYN 7.5 PF PI (GLOVE) ×2 IMPLANT
GLOVE SURG UNDER POLY LF SZ7 (GLOVE) ×3 IMPLANT
GLOVE SURG UNDER POLY LF SZ7.5 (GLOVE) ×2 IMPLANT
GOWN STRL REUS W/ TWL LRG LVL3 (GOWN DISPOSABLE) IMPLANT
GOWN STRL REUS W/ TWL XL LVL3 (GOWN DISPOSABLE) ×1 IMPLANT
GOWN STRL REUS W/TWL LRG LVL3 (GOWN DISPOSABLE)
GOWN STRL REUS W/TWL XL LVL3 (GOWN DISPOSABLE) ×1
GOWN STRL SURGICAL XL XLNG (GOWN DISPOSABLE) ×1 IMPLANT
GOWN TOGA ZIPPER T7+ PEEL AWAY (MISCELLANEOUS) ×2 IMPLANT
HANDPIECE INTERPULSE COAX TIP (DISPOSABLE) ×1
HEAD FEM STD 32X+5 STRL (Hips) IMPLANT
HOOD PEEL AWAY T7 (MISCELLANEOUS) ×1 IMPLANT
IV NS IRRIG 3000ML ARTHROMATIC (IV SOLUTION) ×1 IMPLANT
KIT BASIN OR (CUSTOM PROCEDURE TRAY) ×1 IMPLANT
LINER ACET PNNCL PLUS4 NEUTRAL (Hips) IMPLANT
MARKER SKIN DUAL TIP RULER LAB (MISCELLANEOUS) ×1 IMPLANT
NDL SPNL 18GX3.5 QUINCKE PK (NEEDLE) ×1 IMPLANT
NEEDLE SPNL 18GX3.5 QUINCKE PK (NEEDLE) ×1 IMPLANT
PACK TOTAL JOINT (CUSTOM PROCEDURE TRAY) ×1 IMPLANT
PACK UNIVERSAL I (CUSTOM PROCEDURE TRAY) ×1 IMPLANT
PINNACLE PLUS 4 NEUTRAL (Hips) ×1 IMPLANT
SCREW 6.5MMX30MM (Screw) IMPLANT
SET HNDPC FAN SPRY TIP SCT (DISPOSABLE) ×1 IMPLANT
SOLUTION PRONTOSAN WOUND 350ML (IRRIGATION / IRRIGATOR) ×1 IMPLANT
STAPLER VISISTAT 35W (STAPLE) IMPLANT
STEM FEMORAL SZ 5MM STD ACTIS (Stem) IMPLANT
SUT ETHIBOND 2 V 37 (SUTURE) ×1 IMPLANT
SUT VIC AB 0 CT1 27 (SUTURE) ×1
SUT VIC AB 0 CT1 27XBRD ANBCTR (SUTURE) ×1 IMPLANT
SUT VIC AB 1 CTX 36 (SUTURE) ×1
SUT VIC AB 1 CTX36XBRD ANBCTR (SUTURE) ×1 IMPLANT
SUT VIC AB 2-0 CT1 27 (SUTURE) ×3
SUT VIC AB 2-0 CT1 TAPERPNT 27 (SUTURE) ×2 IMPLANT
SUT VIC AB CT1 27XBRD ANBCTRL (SUTURE) ×1
SYR 50ML LL SCALE MARK (SYRINGE) ×1 IMPLANT
TOWEL GREEN STERILE (TOWEL DISPOSABLE) ×1 IMPLANT
TRAY CATH INTERMITTENT SS 16FR (CATHETERS) IMPLANT
TRAY FOLEY W/BAG SLVR 16FR (SET/KITS/TRAYS/PACK)
TRAY FOLEY W/BAG SLVR 16FR ST (SET/KITS/TRAYS/PACK) IMPLANT
TUBE SUCT ARGYLE STRL (TUBING) ×1 IMPLANT
YANKAUER SUCT BULB TIP NO VENT (SUCTIONS) ×1 IMPLANT

## 2022-12-09 NOTE — Discharge Instructions (Signed)

## 2022-12-09 NOTE — Anesthesia Postprocedure Evaluation (Signed)
Anesthesia Post Note  Patient: Angel Mason  Procedure(s) Performed: TOTAL HIP ARTHROPLASTY ANTERIOR APPROACH (Left: Hip)     Patient location during evaluation: PACU Anesthesia Type: Spinal Level of consciousness: oriented and awake and alert Pain management: pain level controlled Vital Signs Assessment: post-procedure vital signs reviewed and stable Respiratory status: spontaneous breathing, respiratory function stable and nonlabored ventilation Cardiovascular status: blood pressure returned to baseline and stable Postop Assessment: no headache, no backache, no apparent nausea or vomiting, spinal receding and patient able to bend at knees Anesthetic complications: no   No notable events documented.  Last Vitals:  Vitals:   12/09/22 1230 12/09/22 1318  BP: 117/69 120/71  Pulse: 69 74  Resp: 14 20  Temp:  36.7 C  SpO2: 96% 94%    Last Pain:  Vitals:   12/09/22 1325  PainSc: 0-No pain    LLE Motor Response: Purposeful movement (12/09/22 1325) LLE Sensation: Decreased;Tingling (12/09/22 1325) RLE Motor Response: Purposeful movement (12/09/22 1325) RLE Sensation: Full sensation (12/09/22 1325)      Tynika Luddy A.

## 2022-12-09 NOTE — Op Note (Signed)
TOTAL HIP ARTHROPLASTY ANTERIOR APPROACH  Procedure Note Angel Mason   [id number]  Pre-op Diagnosis: left hip osteoarthrits     Post-op Diagnosis: same  Operative Findings Cystic changes of femoral head Severe joint synovitis, effusion Coxa valga   Operative Procedures  1. Total hip replacement; Left hip; uncemented [id number]   Surgeon: Gershon Mussel, M.D.  Assist: Oneal Grout, PA-C   Anesthesia: spinal  Prosthesis: Depuy Acetabulum: Pinnacle 50 mm Femur: Actis 5 STD Head: 32 mm size: +5 Liner: +4 Bearing Type: metal/poly  Total Hip Arthroplasty (Anterior Approach) Op Note:  After informed consent was obtained and the operative extremity marked in the holding area, the patient was brought back to the operating room and placed supine on the HANA table. Next, the operative extremity was prepped and draped in normal sterile fashion. Surgical timeout occurred verifying patient identification, surgical site, surgical procedure and administration of antibiotics.  A 10 cm longitudinal incision was made starting from 2 fingerbreadths lateral and inferior to the ASIS towards the lateral aspect of the patella.  A Hueter approach to the hip was performed, using the interval between tensor fascia lata and sartorius.  Dissection was carried bluntly down onto the anterior hip capsule. The lateral femoral circumflex vessels were identified and coagulated. A capsulotomy was performed and the capsular flaps tagged for later repair.  The neck osteotomy was performed. The femoral head was removed which showed cystic changes and loss of articular cartilage, the acetabular rim was cleared of soft tissue and osteophytes and attention was turned to reaming the acetabulum.  Sequential reaming was performed under fluoroscopic guidance down to the floor of the cotyloid fossa. We reamed to a size 49 mm, and then impacted the acetabular shell. A 30 mm cancellous screw was placed to secure the shell.   The liner was then placed after irrigation and attention turned to the femur.  After placing the femoral hook, the leg was taken to externally rotated, extended and adducted position taking care to perform soft tissue releases to allow for adequate mobilization of the femur. Soft tissue was cleared from the shoulder of the greater trochanter and the hook elevator used to improve exposure of the proximal femur. Sequential broaching performed up to a size 5. Trial neck and head were placed. The leg was brought back up to neutral and the construct reduced.  The position and sizing of components, offset and leg lengths were checked using fluoroscopy. Stability of the construct was checked in 45 degrees of hip extension and 90 degrees of external rotation without any subluxation, shuck or impingement of prosthesis with a +5 head ball. We dislocated the prosthesis, dropped the leg back into position, removed trial components, and irrigated copiously. The final stem and head was then placed, the leg brought back up, the system reduced and fluoroscopy used to verify positioning.  Antibiotic irrigation was placed in the surgical wound.   We irrigated, obtained hemostasis and closed the capsule using #2 ethibond suture.  A topical mixture of 0.25% bupivacaine and meloxicam was placed deep to the fascia.  One gram of vancomycin powder was placed in the surgical bed.   One gram of topical tranexamic acid was injected into the joint.  The fascia was closed with #1 vicryl plus, cellerate was placed in the deep fat layer to promote healing and closure of dead space, the deep fat layer was closed with 0 vicryl, the subcutaneous layers closed with 2.0 Vicryl Plus and the skin closed with 2.0  nylon and dermabond. A sterile dressing was applied. The patient was awakened in the operating room and taken to recovery in stable condition.  All sponge, needle, and instrument counts were correct at the end of the case.   Tessa Lerner, my PA, was a medical necessity for opening, closing, limb positioning, retracting, exposing, and overall facilitation and timely completion of the surgery.  Position: supine  Complications: see description of procedure.  Time Out: performed   Drains/Packing: none  Estimated blood loss: see anesthesia record  Returned to Recovery Room: in good condition.   Antibiotics: yes   Mechanical VTE (DVT) Prophylaxis: sequential compression devices, TED thigh-high  Chemical VTE (DVT) Prophylaxis: aspirin   Fluid Replacement: see anesthesia record  Specimens Removed: 1 to pathology   Sponge and Instrument Count Correct? yes   PACU: portable radiograph - low AP   Plan/RTC: Return in 2 weeks for staple removal. Weight Bearing/Load Lower Extremity: full  Hip precautions: none Suture Removal: 2 weeks   N. Glee Arvin, MD [facility] 11:05 AM   Implant Name Type Inv. Item Serial No. Manufacturer Lot No. LRB No. Used Action  AGENT HMST PWDR BTL CLGN 5GM - [id number] Miscellaneous AGENT HMST PWDR BTL CLGN 5GM  [employer] [id number] Left 1 Implanted  CUP SECTOR GRIPTON - [id number] Cup CUP SECTOR GRIPTON  [employer] [id number] Left 1 Implanted  PINNACLE PLUS 4 NEUTRAL - [id number] Hips PINNACLE PLUS 4 NEUTRAL  [employer]PUY ORTH[employer] [id number] Left 1 Implanted  SCREW 6.5MMX30MM - [id number] Screw SCREW 6.5MMX30MM  [employer]PU[employer]TH[employer] [id number] Left 1 Implanted

## 2022-12-09 NOTE — Evaluation (Signed)
Physical Therapy Evaluation Patient Details Name: Angel Mason MRN: [id number] DOB: 1945/08/07 Today's Date: 12/09/2022  History of Present Illness  Pt is 77 y/o female presenting 10/31 for elective L THA.  PMHx: not pertinent  Clinical Impression  Pt admitted with/for elective THA.  Pt needing CGA at this time.  Pt currently limited functionally due to the problems listed below.  (see problems list.)  Pt will benefit from PT to maximize function and safety to be able to get home safely with available assist .         If plan is discharge home, recommend the following: A little help with walking and/or transfers;A little help with bathing/dressing/bathroom;Assist for transportation;Help with stairs or ramp for entrance   Can travel by private vehicle        Equipment Recommendations None recommended by PT  Recommendations for Other Services       Functional Status Assessment Patient has had a recent decline in their functional status and demonstrates the ability to make significant improvements in function in a reasonable and predictable amount of time.     Precautions / Restrictions Restrictions Weight Bearing Restrictions: Yes LLE Weight Bearing: Weight bearing as tolerated      Mobility  Bed Mobility Overal bed mobility: Needs Assistance Bed Mobility: Supine to Sit, Sit to Supine     Supine to sit: Contact guard Sit to supine: Contact guard assist   General bed mobility comments: cues for direction, no assist    Transfers Overall transfer level: Needs assistance Equipment used: Rolling walker (2 wheels) Transfers: Sit to/from Stand               Transfer via Lift Equipment: [place]  Ambulation/Gait Ambulation/Gait assistance: Contact guard assist Gait Distance (Feet): 15 Feet (x2) Assistive device: Rolling walker (2 wheels) Gait Pattern/deviations: Step-to pattern   Gait velocity interpretation: <1.8 ft/sec, indicate of risk for recurrent falls    General Gait Details: steady gait with safe use of the RW  Stairs            Wheelchair Mobility     Tilt Bed    Modified Rankin (Stroke Patients Only)       Balance Overall balance assessment: No apparent balance deficits (not formally assessed)                                           Pertinent Vitals/Pain Pain Assessment Pain Assessment: Faces Faces Pain Scale: Hurts little more Pain Location: hip Pain Descriptors / Indicators: Discomfort, Grimacing, Guarding Pain Intervention(s): Monitored during session    Home Living Family/patient expects to be discharged to:: Private residence Living Arrangements: Spouse/significant other Available Help at Discharge: Family;Available 24 hours/day Type of Home: House Home Access: Stairs to enter Entrance Stairs-Rails: None Entrance Stairs-Number of Steps: 1 Alternate Level Stairs-Number of Steps: 1 flight Home Layout: Two level;Laundry or work area in [employer] Equipment: Agricultural consultant (2 wheels);Cane - single point;Shower seat      Prior Function Prior Level of Function : Independent/Modified Independent;Driving                     Extremity/Trunk Assessment   Upper Extremity Assessment Upper Extremity Assessment: Overall WFL for tasks assessed    Lower Extremity Assessment Lower Extremity Assessment: Overall WFL for tasks assessed    Cervical / Trunk Assessment Cervical / Trunk Assessment: Normal  Communication   Communication Communication: No apparent difficulties  Cognition Arousal: Alert Behavior During Therapy: WFL for tasks assessed/performed Overall Cognitive Status: Within Functional Limits for tasks assessed                                          General Comments General comments (skin integrity, edema, etc.): Initiated THA exercises    Exercises Total Joint Exercises Ankle Circles/Pumps: AROM, 15 reps, Supine Quad Sets: AROM, Both, 5  reps, Supine Heel Slides: AROM, 10 reps, Supine   Assessment/Plan    PT Assessment Patient needs continued PT services  PT Problem List Decreased activity tolerance;Decreased mobility;Decreased balance;Decreased knowledge of use of DME;Pain       PT Treatment Interventions DME instruction;Gait training;Stair training;Functional mobility training;Therapeutic activities;Balance training;Patient/family education    PT Goals (Current goals can be found in the Care Plan section)  Acute Rehab PT Goals Patient Stated Goal: home independent PT Goal Formulation: With patient Time For Goal Achievement: 12/17/22 Potential to Achieve Goals: Good    Frequency Min 1X/week     Co-evaluation               AM-PAC PT "6 Clicks" Mobility  Outcome Measure Help needed turning from your back to your side while in a flat bed without using bedrails?: A Little Help needed moving from lying on your back to sitting on the side of a flat bed without using bedrails?: A Little Help needed moving to and from a bed to a chair (including a wheelchair)?: A Little Help needed standing up from a chair using your arms (e.g., wheelchair or bedside chair)?: A Little Help needed to walk in hospital room?: A Little Help needed climbing 3-5 steps with a railing? : A Little 6 Click Score: 18    End of Session   Activity Tolerance: Patient tolerated treatment well Patient left: in bed;with call bell/phone within reach Nurse Communication: Mobility status PT Visit Diagnosis: Other abnormalities of gait and mobility (R26.89);Pain Pain - part of body: Hip    Time: 1914-7829 PT Time Calculation (min) (ACUTE ONLY): 30 min   Charges:   PT Evaluation $PT Eval Low Complexity: 1 Low PT Treatments $Gait Training: 8-22 mins PT General Charges $$ ACUTE PT VISIT: 1 Visit         12/09/2022  Jacinto Halim., PT Acute Rehabilitation Services [phone]  (office)  Eliseo Gum Tijuana Scheidegger 12/09/2022, 4:29 PM

## 2022-12-09 NOTE — H&P (Signed)
PREOPERATIVE H&P  Chief Complaint: left hip osteoarthrits  HPI: Angel Mason is a 77 y.o. female who presents for surgical treatment of left hip osteoarthrits.  She denies any changes in medical history.  Past Surgical History:  Procedure Laterality Date   ABDOMINAL HYSTERECTOMY     INCISION AND DRAINAGE BREAST ABSCESS Left    TONSILLECTOMY     removed at 77 yo   VAGINAL DELIVERY     x2   Social History   Socioeconomic History   Marital status: Married    Spouse name: Not on file   Number of children: 2   Years of education: Not on file   Highest education level: Not on file  Occupational History   Not on file  Tobacco Use   Smoking status: Light Smoker   Smokeless tobacco: Never  Vaping Use   Vaping status: Never Used  Substance and Sexual Activity   Alcohol use: Yes    Alcohol/week: 7.0 standard drinks of alcohol    Types: 7 Glasses of wine per week   Drug use: Never   Sexual activity: Not on file  Other Topics Concern   Not on file  Social History Narrative   Not on file   Social Determinants of Health   Financial Resource Strain: Not on file  Food Insecurity: Not on file  Transportation Needs: Not on file  Physical Activity: Not on file  Stress: Not on file  Social Connections: Not on file   No family history on file. Allergies  Allergen Reactions   Codeine Palpitations   Prior to Admission medications   Medication Sig Start Date End Date Taking? Authorizing Provider  atorvastatin (LIPITOR) 10 MG tablet Take 10 mg by mouth daily. 03/10/21  Yes [provider]  Cholecalciferol (VITAMIN D) 50 MCG (2000 UT) tablet Take 2,000 Units by mouth daily.   Yes [provider]  diltiazem (CARDIZEM CD) 180 MG 24 hr capsule Take 180 mg by mouth daily. 01/17/20  Yes [provider]  Multiple Vitamins-Minerals (PRESERVISION AREDS 2) CAPS Take 1 capsule by mouth daily.   Yes [provider]  naproxen sodium (ALEVE) 220 MG tablet  Take 220 mg by mouth daily as needed (pain).   Yes [provider]  ustekinumab Marcy Panning) 45 MG/0.5ML SOSY injection inject 45mg  SUBQ every 3 mos 10/01/21  Yes   docusate sodium (COLACE) 100 MG capsule Take 1 capsule (100 mg total) by mouth daily as needed. 12/01/22 12/01/23  Cristie Hem, PA-C  enoxaparin (LOVENOX) 40 MG/0.4ML injection Inject 0.4 mLs (40 mg total) into the skin daily for 21 days. To be taken after surgery to prevent blood clots 12/02/22 12/23/22  Cristie Hem, PA-C  HYDROcodone-acetaminophen (NORCO) 5-325 MG tablet Take 1 tablet by mouth at bedtime as needed. 11/18/22   Cristie Hem, PA-C  methocarbamol (ROBAXIN-750) 750 MG tablet Take 1 tablet (750 mg total) by mouth 2 (two) times daily as needed for muscle spasms. 12/01/22   Cristie Hem, PA-C  ondansetron (ZOFRAN) 4 MG tablet Take 1 tablet (4 mg total) by mouth every 8 (eight) hours as needed for nausea or vomiting. 12/01/22   Cristie Hem, PA-C  oxyCODONE-acetaminophen (PERCOCET) 5-325 MG tablet Take 1-2 tablets by mouth every 8 (eight) hours as needed. To be taken after surgery 12/01/22   Cristie Hem, PA-C  rivaroxaban (XARELTO) 10 MG TABS tablet Take 1 tablet (10 mg total) by mouth daily. To be taken after surgery instead  of aspirin to prevent blood clots 12/01/22   Cristie Hem, PA-C     Positive ROS: All other systems have been reviewed and were otherwise negative with the exception of those mentioned in the HPI and as above.  Physical Exam: General: Alert, no acute distress Cardiovascular: No pedal edema Respiratory: No cyanosis, no use of accessory musculature GI: abdomen soft Skin: No lesions in the area of chief complaint Neurologic: Sensation intact distally Psychiatric: Patient is competent for consent with normal mood and affect Lymphatic: no lymphedema  MUSCULOSKELETAL: exam stable  Assessment: left hip osteoarthrits  Plan: Plan for Procedure(s): TOTAL HIP  ARTHROPLASTY ANTERIOR APPROACH  The risks benefits and alternatives were discussed with the patient including but not limited to the risks of nonoperative treatment, versus surgical intervention including infection, bleeding, nerve injury,  blood clots, cardiopulmonary complications, morbidity, mortality, among others, and they were willing to proceed.   Glee Arvin, MD 12/09/2022 6:26 AM

## 2022-12-09 NOTE — Anesthesia Procedure Notes (Signed)
Spinal  Patient location during procedure: OR Start time: 12/09/2022 9:47 AM End time: 12/09/2022 9:51 AM Reason for block: surgical anesthesia Staffing Performed: anesthesiologist  Anesthesiologist: Mal Amabile, MD Performed by: Mal Amabile, MD Authorized by: Mal Amabile, MD   Preanesthetic Checklist Completed: patient identified, IV checked, site marked, risks and benefits discussed, surgical consent, monitors and equipment checked, pre-op evaluation and timeout performed Spinal Block Patient position: sitting Prep: DuraPrep and site prepped and draped Patient monitoring: heart rate, cardiac monitor, continuous pulse ox and blood pressure Approach: midline Location: L3-4 Injection technique: single-shot Needle Needle type: Pencan  Needle gauge: 24 G Needle length: 9 cm Needle insertion depth: 7 cm Assessment Sensory level: T4 Events: CSF return Additional Notes Patient tolerated procedure well. Adequate sensory level.

## 2022-12-09 NOTE — Transfer of Care (Signed)
Immediate Anesthesia Transfer of Care Note  Patient: Angel Mason  Procedure(s) Performed: TOTAL HIP ARTHROPLASTY ANTERIOR APPROACH (Left: Hip)  Patient Location: PACU  Anesthesia Type:Spinal  Level of Consciousness: awake, alert , and oriented  Airway & Oxygen Therapy: Patient Spontanous Breathing  Post-op Assessment: Report given to RN and Post -op Vital signs reviewed and stable  Post vital signs: Reviewed and stable  Last Vitals:  Vitals Value Taken Time  BP 95/56 12/09/22 1139  Temp    Pulse 81 12/09/22 1140  Resp 15 12/09/22 1140  SpO2 96 % 12/09/22 1140  Vitals shown include unfiled device data.  Last Pain:  Vitals:   12/09/22 0732  PainSc: 7       Patients Stated Pain Goal: 2 (12/09/22 0732)  Complications: No notable events documented.

## 2022-12-10 DIAGNOSIS — M1612 Unilateral primary osteoarthritis, left hip: Secondary | ICD-10-CM | POA: Diagnosis not present

## 2022-12-10 NOTE — Progress Notes (Signed)
Patient alert and oriented, void and ambulate. Surgical site clean and dry no sign of infection. D/c instructions explain and given all questions answered. Pt. D/c home per order.

## 2022-12-10 NOTE — Plan of Care (Signed)
  Problem: Education: Goal: Knowledge of General Education information will improve Description: Including pain rating scale, medication(s)/side effects and non-pharmacologic comfort measures Outcome: Completed/Met

## 2022-12-10 NOTE — Progress Notes (Signed)
Subjective: 1 Day Post-Op Procedure(s) (LRB): TOTAL HIP ARTHROPLASTY ANTERIOR APPROACH (Left) Patient reports pain as mild.    Objective: Vital signs in last 24 hours: Temp:  [97 F (36.1 C)-100.3 F (37.9 C)] 98.6 F (37 C) (11/01 9562) Pulse Rate:  [61-97] 97 (11/01 0712) Resp:  [14-20] 19 (11/01 0712) BP: (84-134)/(52-81) 129/81 (11/01 0712) SpO2:  [93 %-99 %] 93 % (11/01 0712)  Intake/Output from previous day: 10/31 0701 - 11/01 0700 In: 1870 [P.O.:720; I.V.:900; IV Piggyback:250] Out: 150 [Blood:150] Intake/Output this shift: No intake/output data recorded.  No results for input(s): "HGB" in the last 72 hours. No results for input(s): "WBC", "RBC", "HCT", "PLT" in the last 72 hours. No results for input(s): "NA", "K", "CL", "CO2", "BUN", "CREATININE", "GLUCOSE", "CALCIUM" in the last 72 hours. No results for input(s): "LABPT", "INR" in the last 72 hours.  Neurologically intact Neurovascular intact Sensation intact distally Intact pulses distally Dorsiflexion/Plantar flexion intact Incision: dressing C/D/I No cellulitis present Compartment soft   Assessment/Plan: 1 Day Post-Op Procedure(s) (LRB): TOTAL HIP ARTHROPLASTY ANTERIOR APPROACH (Left) Advance diet Up with therapy D/C IV fluids Discharge home with home health once cleared by PT WBAT LLE       Angel Mason 12/10/2022, 7:52 AM

## 2022-12-10 NOTE — Progress Notes (Signed)
Physical Therapy Treatment Patient Details Name: Angel Mason MRN: [id number] DOB: 1945-10-24 Today's Date: 12/10/2022   History of Present Illness Pt is 77 y/o female presenting 10/31 for elective L THA.  PMHx: nothing significant related to this admission    PT Comments  Pt progressing well with mobility and exercises. PT feels safe and ready for DC today. I encouraged her to use the RW in the short term (for pain control and stability) at least as she was up in the room with cane when I arrived to her room. I have encouraged the patient to gradually increase activity daily to tolerance.  I have answered all patient's questions regarding PT and mobility.  Recommend f/u PT to progress exercises and gait.    If plan is discharge home, recommend the following:     Can travel by private vehicle        Equipment Recommendations  None recommended by PT    Recommendations for Other Services       Precautions / Restrictions Precautions Precautions: Fall Restrictions Weight Bearing Restrictions: Yes LLE Weight Bearing: Weight bearing as tolerated   Mobility  Bed Mobility Overal bed mobility: Modified Independent             General bed mobility comments: requires increased time and effort but able to complete without assist    Transfers Overall transfer level: Needs assistance Equipment used: None Transfers: Sit to/from Stand Sit to Stand: Supervision (Cues for safest technique)                Ambulation/Gait Ambulation/Gait assistance: Supervision Gait Distance (Feet): 100 Feet Assistive device: Rolling walker (2 wheels) Gait Pattern/deviations: Step-through pattern, Decreased stride length Gait velocity: decreased from norms     General Gait Details: no loss of balance.   Stairs Stairs: Yes       General stair comments: We verbally discussed safest technique to do 1 stair and flight, Pt declined practicing at this time as she stated she had been doing  the technique we discussed already.   Wheelchair Mobility     Tilt Bed    Modified Rankin (Stroke Patients Only)       Balance Overall balance assessment: Mild deficits observed, not formally tested (defecits due to recent surgery and weakness in LLE)                                          Cognition Arousal: Alert Behavior During Therapy: WFL for tasks assessed/performed Overall Cognitive Status: Within Functional Limits for tasks assessed                                          Exercises Total Joint Exercises Ankle Circles/Pumps: AROM, Left, 10 reps Short Arc Quad: AROM, Left, 10 reps, Supine Heel Slides: AAROM, Left, 10 reps, Supine Hip ABduction/ADduction: AAROM, Left, 10 reps, Supine Long Arc Quad: AROM, Left, 10 reps, Seated    General Comments General comments (skin integrity, edema, etc.): No family present.      Pertinent Vitals/Pain Pain Assessment Pain Assessment: 0-10 Pain Score: 2  Pain Location: left hip and left later knee area Pain Descriptors / Indicators: Discomfort Pain Intervention(s): Limited activity within patient's tolerance, Monitored during session, Premedicated before session    Home Living  Prior Function            PT Goals (current goals can now be found in the care plan section) Acute Rehab PT Goals Patient Stated Goal: to be independent Progress towards PT goals: Progressing toward goals    Frequency    7X/week      PT Plan      Co-evaluation              AM-PAC PT "6 Clicks" Mobility   Outcome Measure  Help needed turning from your back to your side while in a flat bed without using bedrails?: None Help needed moving from lying on your back to sitting on the side of a flat bed without using bedrails?: None Help needed moving to and from a bed to a chair (including a wheelchair)?: A Little Help needed standing up from a chair using your  arms (e.g., wheelchair or bedside chair)?: A Little Help needed to walk in hospital room?: A Little Help needed climbing 3-5 steps with a railing? : A Little 6 Click Score: 20    End of Session Equipment Utilized During Treatment: Gait belt Activity Tolerance: Patient tolerated treatment well Patient left: in bed;with call bell/phone within reach Nurse Communication: Mobility status PT Visit Diagnosis: Other abnormalities of gait and mobility (R26.89);Pain Pain - Right/Left: Left Pain - part of body: Hip     Time: 1610-9604 PT Time Calculation (min) (ACUTE ONLY): 27 min  Charges:    $Gait Training: 8-22 mins $Therapeutic Exercise: 8-22 mins PT General Charges $$ ACUTE PT VISIT: 1 Visit                     Angel Mason, PT   Acute Rehabilitation Services  Office [phone] 12/10/2022    Angel Mason 12/10/2022, 10:13 AM

## 2022-12-10 NOTE — Plan of Care (Signed)
  Problem: Education: Goal: Knowledge of the prescribed therapeutic regimen will improve Outcome: Completed/Met Goal: Understanding of discharge needs will improve Outcome: Completed/Met Goal: Individualized Educational Video(s) Outcome: Completed/Met   Problem: Activity: Goal: Ability to avoid complications of mobility impairment will improve Outcome: Completed/Met Goal: Ability to tolerate increased activity will improve Outcome: Completed/Met   Problem: Clinical Measurements: Goal: Postoperative complications will be avoided or minimized Outcome: Completed/Met   Problem: Skin Integrity: Goal: Will show signs of wound healing Outcome: Completed/Met

## 2022-12-12 ENCOUNTER — Other Ambulatory Visit: Payer: Self-pay

## 2022-12-12 ENCOUNTER — Emergency Department (HOSPITAL_COMMUNITY)
Admission: EM | Admit: 2022-12-12 | Discharge: 2022-12-12 | Disposition: A | Discharge status: Home / Self Care | Payer: Medicare Other | Attending: Emergency Medicine | Admitting: Emergency Medicine

## 2022-12-12 ENCOUNTER — Emergency Department (HOSPITAL_COMMUNITY): Payer: Medicare Other

## 2022-12-12 ENCOUNTER — Encounter (HOSPITAL_COMMUNITY): Payer: Self-pay

## 2022-12-12 DIAGNOSIS — M25552 Pain in left hip: Secondary | ICD-10-CM | POA: Insufficient documentation

## 2022-12-12 DIAGNOSIS — G8918 Other acute postprocedural pain: Secondary | ICD-10-CM | POA: Insufficient documentation

## 2022-12-12 LAB — URINALYSIS, ROUTINE W REFLEX MICROSCOPIC
Bilirubin Urine: NEGATIVE
Glucose, UA: NEGATIVE mg/dL
Hgb urine dipstick: NEGATIVE
Ketones, ur: NEGATIVE mg/dL
Leukocytes,Ua: NEGATIVE
Nitrite: NEGATIVE
Protein, ur: NEGATIVE mg/dL
Specific Gravity, Urine: 1.005 (ref 1.005–1.030)
pH: 6 (ref 5.0–8.0)

## 2022-12-12 LAB — BASIC METABOLIC PANEL
Anion gap: 11 (ref 5–15)
BUN: 15 mg/dL (ref 8–23)
CO2: 25 mmol/L (ref 22–32)
Calcium: 9 mg/dL (ref 8.9–10.3)
Chloride: 101 mmol/L (ref 98–111)
Creatinine, Ser: 0.57 mg/dL (ref 0.44–1.00)
GFR, Estimated: >60 mL/min (ref >60)
Glucose, Bld: 109 mg/dL — ABNORMAL HIGH (ref 70–99)
Potassium: 3.9 mmol/L (ref 3.5–5.1)
Sodium: 137 mmol/L (ref 135–145)

## 2022-12-12 LAB — CBC
HCT: 36.6 % (ref 36.0–46.0)
Hemoglobin: 11.8 g/dL — ABNORMAL LOW (ref 12.0–15.0)
MCH: 29 pg (ref 26.0–34.0)
MCHC: 32.2 g/dL (ref 30.0–36.0)
MCV: 89.9 fL (ref 80.0–100.0)
Platelets: 311 10*3/uL (ref 150–400)
RBC: 4.07 MIL/uL (ref 3.87–5.11)
RDW: 14 % (ref 11.5–15.5)
WBC: 15.9 10*3/uL — ABNORMAL HIGH (ref 4.0–10.5)
nRBC: 0.1 % (ref 0.0–0.2)

## 2022-12-12 MED ORDER — ONDANSETRON HCL 4 MG/2ML IJ SOLN
4.0000 mg | Freq: Once | INTRAMUSCULAR | Status: AC
  Administered 2022-12-12: 4 mg via INTRAVENOUS
  Filled 2022-12-12: qty 2

## 2022-12-12 MED ORDER — MORPHINE SULFATE (PF) 4 MG/ML IV SOLN
4.0000 mg | Freq: Once | INTRAVENOUS | Status: AC
  Administered 2022-12-12: 4 mg via INTRAVENOUS
  Filled 2022-12-12: qty 1

## 2022-12-12 MED ORDER — CEPHALEXIN 500 MG PO CAPS: 500.0000 mg | ORAL_CAPSULE | Freq: Four times a day (QID) | ORAL | 0 refills | Status: AC

## 2022-12-12 MED ORDER — CEPHALEXIN 250 MG PO CAPS
500.0000 mg | ORAL_CAPSULE | Freq: Once | ORAL | Status: AC
  Administered 2022-12-12: 500 mg via ORAL
  Filled 2022-12-12: qty 2

## 2022-12-12 NOTE — ED Notes (Addendum)
Pt had left hip surgery.  Left leg noted to swollen.  Pt stated that it felt "like bone on bone."  She had no numbness, tingling, or sensation changes in the limb. Pt ambulatory to the restroom with walker and paramedic.

## 2022-12-12 NOTE — ED Notes (Signed)
Returned from xray

## 2022-12-12 NOTE — ED Notes (Signed)
Wound re-bandaged prior to departure.

## 2022-12-12 NOTE — ED Triage Notes (Signed)
Pt had a left total hip replacement on Thursday, pt c.o pain since the surgery but worse last night. Surgical bandage in place, no obvious drainage to bandage but hip is swollen and skin is pink.

## 2022-12-12 NOTE — ED Provider Notes (Signed)
[facility] Provider Note   CSN: [id number] Arrival date & time: 12/12/22  4098     History  Chief Complaint  Patient presents with   Post-op Problem   Hip Pain    Angel Mason is a 77 y.o. female.  Patient with c/o feeling something is wrong with her left hip after recent replacement surgery three days ago. Indicates felt was doing fine, ambulating, but in past day has felt increased pain and popping/movement sensation in left hip. Denies trauma/fall. Notes persistent pain to area, that is somewhat improved w home pain meds. No leg numbness/weakness. No fever or chills.   The history is provided by the patient, the spouse and medical records.  Hip Pain Pertinent negatives include no chest pain and no shortness of breath.       Home Medications Prior to Admission medications   Medication Sig Start Date End Date Taking? Authorizing Provider  atorvastatin (LIPITOR) 10 MG tablet Take 10 mg by mouth daily. 03/10/21   [provider]  Cholecalciferol (VITAMIN D) 50 MCG (2000 UT) tablet Take 2,000 Units by mouth daily.    [provider]  diltiazem (CARDIZEM CD) 180 MG 24 hr capsule Take 180 mg by mouth daily. 01/17/20   [provider]  docusate sodium (COLACE) 100 MG capsule Take 1 capsule (100 mg total) by mouth daily as needed. 12/01/22 12/01/23  Cristie Hem, PA-C  enoxaparin (LOVENOX) 40 MG/0.4ML injection Inject 0.4 mLs (40 mg total) into the skin daily for 21 days. To be taken after surgery to prevent blood clots 12/02/22 12/23/22  Cristie Hem, PA-C  methocarbamol (ROBAXIN-750) 750 MG tablet Take 1 tablet (750 mg total) by mouth 2 (two) times daily as needed for muscle spasms. 12/01/22   Cristie Hem, PA-C  Multiple Vitamins-Minerals (PRESERVISION AREDS 2) CAPS Take 1 capsule by mouth daily.    [provider]  ondansetron (ZOFRAN) 4 MG tablet Take 1 tablet (4 mg total) by mouth every 8  (eight) hours as needed for nausea or vomiting. 12/01/22   Cristie Hem, PA-C  oxyCODONE-acetaminophen (PERCOCET) 5-325 MG tablet Take 1-2 tablets by mouth every 8 (eight) hours as needed. To be taken after surgery 12/01/22   Cristie Hem, PA-C  ustekinumab Marcy Panning) 45 MG/0.5ML SOSY injection inject 45mg  SUBQ every 3 mos 10/01/21         Allergies    Codeine    Review of Systems   Review of Systems  Constitutional:  Negative for chills and fever.  Respiratory:  Negative for cough and shortness of breath.   Cardiovascular:  Negative for chest pain.  Gastrointestinal:  Negative for nausea and vomiting.  Genitourinary:  Negative for dysuria.  Musculoskeletal:        Pain left hip  Neurological:  Negative for weakness and numbness.    Physical Exam Updated Vital Signs BP 117/62   Pulse 68   Temp 98.1 F (36.7 C) (Oral)   Resp 14   Ht 1.651 m (5\' 5" )   Wt 81.6 kg   SpO2 92%   BMI 29.95 kg/m  Physical Exam Vitals and nursing note reviewed.  Constitutional:      Appearance: Normal appearance. She is well-developed.  HENT:     Head: Atraumatic.     Nose: Nose normal.     Mouth/Throat:     Mouth: Mucous membranes are moist.  Eyes:     General: No scleral icterus.  Conjunctiva/sclera: Conjunctivae normal.  Neck:     Trachea: No tracheal deviation.  Cardiovascular:     Rate and Rhythm: Normal rate and regular rhythm.     Pulses: Normal pulses.     Heart sounds: Normal heart sounds. No murmur heard.    No friction rub. No gallop.  Pulmonary:     Effort: Pulmonary effort is normal. No respiratory distress.     Breath sounds: Normal breath sounds.  Abdominal:     General: Bowel sounds are normal. There is no distension.     Palpations: Abdomen is soft.     Tenderness: There is no abdominal tenderness.  Genitourinary:    Comments: No cva tenderness.  Musculoskeletal:        General: No swelling.     Cervical back: Normal range of motion and neck supple. No  rigidity. No muscular tenderness.     Comments: Dressing clean/dry/intact. There is mildly surrounding erythema (pt indicates present since surgery, not expanding). No crepitus. Compartments of LLE are soft, not tense, not severely swollen. Distal pulses palp. No obvious deformity, either rotational or shortening, to LLE.   Skin:    General: Skin is warm and dry.     Findings: No rash.  Neurological:     Mental Status: She is alert.     Comments: Alert, speech normal. LLE nvi w intact motor/sens fxn.   Psychiatric:        Mood and Affect: Mood normal.     ED Results / Procedures / Treatments   Labs (all labs ordered are listed, but only abnormal results are displayed) Results for orders placed or performed during the hospital encounter of 12/12/22  CBC  Result Value Ref Range   WBC 15.9 (H) 4.0 - 10.5 K/uL   RBC 4.07 3.87 - 5.11 MIL/uL   Hemoglobin 11.8 (L) 12.0 - 15.0 g/dL   HCT 09.8 11.9 - 14.7 %   MCV 89.9 80.0 - 100.0 fL   MCH 29.0 26.0 - 34.0 pg   MCHC 32.2 30.0 - 36.0 g/dL   RDW 82.9 56.2 - 13.0 %   Platelets 311 150 - 400 K/uL   nRBC 0.1 0.0 - 0.2 %  Basic metabolic panel  Result Value Ref Range   Sodium 137 135 - 145 mmol/L   Potassium 3.9 3.5 - 5.1 mmol/L   Chloride 101 98 - 111 mmol/L   CO2 25 22 - 32 mmol/L   Glucose, Bld 109 (H) 70 - 99 mg/dL   BUN 15 8 - 23 mg/dL   Creatinine, Ser 8.65 0.44 - 1.00 mg/dL   Calcium 9.0 8.9 - 78.4 mg/dL   GFR, Estimated >69 >62 mL/min   Anion gap 11 5 - 15  Urinalysis, Routine w reflex microscopic -Urine, Clean Catch  Result Value Ref Range   Color, Urine STRAW (A) YELLOW   APPearance CLEAR CLEAR   Specific Gravity, Urine 1.005 1.005 - 1.030   pH 6.0 5.0 - 8.0   Glucose, UA NEGATIVE NEGATIVE mg/dL   Hgb urine dipstick NEGATIVE NEGATIVE   Bilirubin Urine NEGATIVE NEGATIVE   Ketones, ur NEGATIVE NEGATIVE mg/dL   Protein, ur NEGATIVE NEGATIVE mg/dL   Nitrite NEGATIVE NEGATIVE   Leukocytes,Ua NEGATIVE NEGATIVE   DG Hip  Unilat W or Wo Pelvis 2-3 Views Left  Result Date: 12/12/2022 CLINICAL DATA:  Pain. EXAM: DG HIP (WITH OR WITHOUT PELVIS) 3V LEFT COMPARISON:  None Available. FINDINGS: Left hip arthroplasty identified with screw fixated acetabular cup and Press-Fit  femoral component. No fracture or dislocation. Minimal joint space loss of the left sacroiliac joint. There is some sclerosis along the pubic symphysis. Osteopenia. No fracture or dislocation. There is some soft tissue gas noted lateral to the left hip. Please correlate with history of recent surgery. Presumed multiple vascular calcifications in the pelvis. IMPRESSION: Left hip total arthroplasty.  No hardware failure. Gas seen in the soft tissues lateral to the left hip. Please correlate with the history of surgery. Electronically Signed   By: Karen Kays M.D.   On: 12/12/2022 11:15   DG Pelvis Portable  Result Date: 12/09/2022 CLINICAL DATA:  Hip pain. Status post anterior approach hip arthroplasty. EXAM: PORTABLE PELVIS 1-2 VIEWS COMPARISON:  One-view pelvis 07/27/2022 FINDINGS: Left total hip arthroplasty is present. Femoral and acetabular components appear to be well seated. No acute fractures are present. The hip appears located on this single view. The visualized pelvis and right hip are unremarkable. IMPRESSION: Left total hip arthroplasty without radiographic evidence for complication. Electronically Signed   By: Marin Roberts M.D.   On: 12/09/2022 15:51   DG HIP UNILAT WITH PELVIS 1V LEFT  Result Date: 12/09/2022 CLINICAL DATA:  Elective surgery. EXAM: DG HIP (WITH OR WITHOUT PELVIS) 1V*L* COMPARISON:  Preoperative imaging. FINDINGS: Nine fluoroscopic spot views of the pelvis and left hip obtained in the operating room. Sequential images during hip arthroplasty. Fluoroscopy time 23.8 seconds. Dose 3.44 mGy. IMPRESSION: Intraoperative fluoroscopy during left hip arthroplasty. Electronically Signed   By: Narda Rutherford M.D.   On: 12/09/2022  12:42   DG C-Arm 1-60 Min-No Report  Result Date: 12/09/2022 Fluoroscopy was utilized by the requesting physician.  No radiographic interpretation.   DG C-Arm 1-60 Min-No Report  Result Date: 12/09/2022 Fluoroscopy was utilized by the requesting physician.  No radiographic interpretation.     EKG None  Radiology DG Hip Unilat W or Wo Pelvis 2-3 Views Left  Result Date: 12/12/2022 CLINICAL DATA:  Pain. EXAM: DG HIP (WITH OR WITHOUT PELVIS) 3V LEFT COMPARISON:  None Available. FINDINGS: Left hip arthroplasty identified with screw fixated acetabular cup and Press-Fit femoral component. No fracture or dislocation. Minimal joint space loss of the left sacroiliac joint. There is some sclerosis along the pubic symphysis. Osteopenia. No fracture or dislocation. There is some soft tissue gas noted lateral to the left hip. Please correlate with history of recent surgery. Presumed multiple vascular calcifications in the pelvis. IMPRESSION: Left hip total arthroplasty.  No hardware failure. Gas seen in the soft tissues lateral to the left hip. Please correlate with the history of surgery. Electronically Signed   By: Karen Kays M.D.   On: 12/12/2022 11:15    Procedures Procedures    Medications Ordered in ED Medications  cephALEXin (KEFLEX) capsule 500 mg (has no administration in time range)  morphine (PF) 4 MG/ML injection 4 mg (4 mg Intravenous Given 12/12/22 1027)  ondansetron (ZOFRAN) injection 4 mg (4 mg Intravenous Given 12/12/22 1027)    ED Course/ Medical Decision Making/ A&P                                 Medical Decision Making Problems Addressed: Left hip pain: acute illness or injury with systemic symptoms Post-operative pain: acute illness or injury with systemic symptoms  Amount and/or Complexity of Data Reviewed Independent Historian: spouse    Details: hx External Data Reviewed: notes. Labs: ordered. Decision-making details documented in ED Course. Radiology:  ordered and independent interpretation performed. Decision-making details documented in ED Course.  Risk Prescription drug management. Parenteral controlled substances. Decision regarding hospitalization.   Iv ns. Continuous pulse ox and cardiac monitoring. Labs ordered/sent. Imaging ordered.   Differential diagnosis includes post op pain, post op complication, etc. Dispo decision including potential need for admission considered - will get labs and imaging and reassess.   Reviewed nursing notes and prior charts for additional history. External reports reviewed. Additional history from: spouse.   Morphine iv. Zofran iv.   Cardiac monitor: sinus rhythm, rate 78.  Labs reviewed/interpreted by me - chem normal. Wbc elev.   Xrays reviewed/interpreted by me - hardware intact.   Ortho consulted, discussed pt, exam, imaging, etc, with Dr Steward Drone - he indicates to have patient call Dr Roda Shutters tomorrow, and he leave word with office to facilitate prompt follow up.   Additional labs reviewed/interpreted by me - ua neg for uti.  With area mild erythema/warmth to proximal thigh, ?mild/early cellulitis. Keflex po. Rx for home. Rec ortho f/u tomorrow.   Recheck pain controlled, pt drinking fluids.   Pt currently appears stable for d/c.   Return precautions provided.             Final Clinical Impression(s) / ED Diagnoses Final diagnoses:  None    Rx / DC Orders ED Discharge Orders     None         Cathren Laine, MD 12/12/22 1347

## 2022-12-12 NOTE — ED Notes (Signed)
Patient transported to X-ray 

## 2022-12-12 NOTE — Discharge Instructions (Signed)
It was our pleasure to provide your ER care today - we hope that you feel better.  We discussed your case with the covering orthopedist for Dr Roda Shutters - he indicates for you to follow up  closely with Dr Roda Shutters in the office in the next 1-2 days - call office tomorrow AM to arrange appointment time.   For mild redness, to thigh area, take keflex (antibiotic) as prescribed.  For pain/post-op pain, take your oxycodone and/or robaxin as need for pain.   Return to ER if worse, new symptoms, high fevers, increased swelling/spreading redness, or other concern.

## 2022-12-12 NOTE — ED Notes (Signed)
Pt stated she was nauseous due to hunger.  Message sent to Dr. Denton Lank.

## 2022-12-12 NOTE — ED Notes (Signed)
Pt's family agitated about not being able to eat.  Explained to the family that Dr. Denton Lank had not given the OK. Dr. Denton Lank messaged again.

## 2022-12-13 ENCOUNTER — Encounter (HOSPITAL_COMMUNITY): Payer: Self-pay | Admitting: Orthopaedic Surgery

## 2022-12-13 NOTE — Discharge Summary (Cosign Needed)
Patient ID: Angel Mason MRN: [id number] DOB/AGE: [birth year] 75 y.o.  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 female who was admitted 12/09/2022 for operative treatment ofPrimary osteoarthritis of left hip. Patient has severe unremitting pain that affects sleep, daily activities, and work/hobbies. After pre-op clearance the patient was taken to the operating room on 12/09/2022 and underwent  Procedure(s): TOTAL HIP ARTHROPLASTY ANTERIOR APPROACH.    Patient was given perioperative antibiotics:  Anti-infectives (From admission, onward)    Start     Dose/Rate Route Frequency Ordered Stop   12/09/22 1600  ceFAZolin (ANCEF) IVPB 2g/100 mL premix        2 g 200 mL/hr over 30 Minutes Intravenous Every 6 hours 12/09/22 1315 12/10/22 0753   12/09/22 1033  vancomycin (VANCOCIN) powder  Status:  Discontinued          As needed 12/09/22 1034 12/09/22 1146   12/09/22 0715  ceFAZolin (ANCEF) IVPB 2g/100 mL premix        2 g 200 mL/hr over 30 Minutes Intravenous On call to O.R. 12/09/22 4098 12/09/22 0954        Patient was given sequential compression devices, early ambulation, and chemoprophylaxis to prevent DVT.  Patient benefited maximally from hospital stay and there were no complications.    Recent  vital signs: No data found.   Recent laboratory studies:  Recent Labs    12/12/22 1011  WBC 15.9*  HGB 11.8*  HCT 36.6  PLT 311  NA 137  K 3.9  CL 101  CO2 25  BUN 15  CREATININE 0.57  GLUCOSE 109*  CALCIUM 9.0     Discharge Medications:   Allergies as of 12/10/2022       Reactions   Codeine Palpitations        Medication List     STOP taking these medications    HYDROcodone-acetaminophen 5-325 MG tablet Commonly known as: Norco   naproxen sodium 220 MG tablet Commonly known as: ALEVE   rivaroxaban 10 MG Tabs tablet Commonly known as: XARELTO       TAKE these medications    atorvastatin 10 MG tablet Commonly known as: LIPITOR Take 10 mg by mouth daily.   diltiazem 180 MG 24 hr capsule Commonly known as: CARDIZEM CD Take 180 mg by mouth daily.   docusate sodium 100 MG capsule Commonly known as: Colace Take 1 capsule (100 mg total) by mouth daily as needed.   enoxaparin 40 MG/0.4ML injection Commonly known as: LOVENOX Inject 0.4 mLs (40 mg total) into the skin daily for 21 days. To be taken after surgery to prevent blood clots   methocarbamol 750 MG tablet Commonly known as: Robaxin-750 Take 1 tablet (750 mg total) by mouth 2 (two) times daily as needed for muscle spasms.   ondansetron 4 MG tablet Commonly  known as: Zofran Take 1 tablet (4 mg total) by mouth every 8 (eight) hours as needed for nausea or vomiting.   oxyCODONE-acetaminophen 5-325 MG tablet Commonly known as: Percocet Take 1-2 tablets by mouth every 8 (eight) hours as needed. To be taken after surgery   PreserVision AREDS 2 Caps Take 1 capsule by mouth daily.   Stelara 45 MG/0.5ML injection Generic drug: ustekinumab inject 45mg  SUBQ every 3 mos   Vitamin D 50 MCG (2000 UT) tablet Take 2,000 Units by mouth daily.        Diagnostic Studies: DG Hip Unilat W or Wo Pelvis 2-3 Views Left  Result Date: 12/12/2022 CLINICAL DATA:  Pain. EXAM: DG HIP (WITH OR WITHOUT  PELVIS) 3V LEFT COMPARISON:  None Available. FINDINGS: Left hip arthroplasty identified with screw fixated acetabular cup and Press-Fit femoral component. No fracture or dislocation. Minimal joint space loss of the left sacroiliac joint. There is some sclerosis along the pubic symphysis. Osteopenia. No fracture or dislocation. There is some soft tissue gas noted lateral to the left hip. Please correlate with history of recent surgery. Presumed multiple vascular calcifications in the pelvis. IMPRESSION: Left hip total arthroplasty.  No hardware failure. Gas seen in the soft tissues lateral to the left hip. Please correlate with the history of surgery. Electronically Signed   By: Karen Kays M.D.   On: 12/12/2022 11:15   DG Pelvis Portable  Result Date: 12/09/2022 CLINICAL DATA:  Hip pain. Status post anterior approach hip arthroplasty. EXAM: PORTABLE PELVIS 1-2 VIEWS COMPARISON:  One-view pelvis 07/27/2022 FINDINGS: Left total hip arthroplasty is present. Femoral and acetabular components appear to be well seated. No acute fractures are present. The hip appears located on this single view. The visualized pelvis and right hip are unremarkable. IMPRESSION: Left total hip arthroplasty without radiographic evidence for complication. Electronically Signed   By: Marin Roberts M.D.   On: 12/09/2022 15:51   DG HIP UNILAT WITH PELVIS 1V LEFT  Result Date: 12/09/2022 CLINICAL DATA:  Elective surgery. EXAM: DG HIP (WITH OR WITHOUT PELVIS) 1V*L* COMPARISON:  Preoperative imaging. FINDINGS: Nine fluoroscopic spot views of the pelvis and left hip obtained in the operating room. Sequential images during hip arthroplasty. Fluoroscopy time 23.8 seconds. Dose 3.44 mGy. IMPRESSION: Intraoperative fluoroscopy during left hip arthroplasty. Electronically Signed   By: Narda Rutherford M.D.   On: 12/09/2022 12:42   DG C-Arm 1-60 Min-No Report  Result Date: 12/09/2022 Fluoroscopy was utilized by the requesting  physician.  No radiographic interpretation.   DG C-Arm 1-60 Min-No Report  Result Date: 12/09/2022 Fluoroscopy was utilized by the requesting physician.  No radiographic interpretation.    Disposition: Discharge disposition: 01-Home or Self Care          Follow-up Information     Cristie Hem, PA-C. Schedule an appointment as soon as possible for a visit in 2 week(s).   Specialty: Orthopedic Surgery Contact information: [address] [place] [address] [phone]         [facility] Follow up.   Why: The home heatlh agency will contact you for the first home visit Contact information: [phone]                 Signed: Cristie Hem 12/13/2022, 9:28 AM

## 2022-12-24 ENCOUNTER — Ambulatory Visit (INDEPENDENT_AMBULATORY_CARE_PROVIDER_SITE_OTHER): Payer: Medicare Other | Admitting: Physician Assistant

## 2022-12-24 DIAGNOSIS — Z96642 Presence of left artificial hip joint: Secondary | ICD-10-CM

## 2022-12-24 MED ORDER — METHOCARBAMOL 750 MG PO TABS: 750.0000 mg | ORAL_TABLET | Freq: Three times a day (TID) | ORAL | 2 refills | Status: AC | PRN

## 2022-12-24 MED ORDER — OXYCODONE-ACETAMINOPHEN 5-325 MG PO TABS: 1.0000 | ORAL_TABLET | Freq: Three times a day (TID) | ORAL | 0 refills | Status: AC | PRN

## 2022-12-24 NOTE — Progress Notes (Signed)
Post-Op Visit Note   Patient: Angel Mason           Date of Birth: 31-Dec-1945           MRN: [id number] Visit Date: 12/24/2022 PCP: Isabella Bowens, MD   Assessment & Plan:  Chief Complaint:  Chief Complaint  Patient presents with   Left Hip - Routine Post Op   Visit Diagnoses:  1. Status post total replacement of left hip     Plan: Patient is a 77 year old female who comes in today 2 weeks status post left total hip replacement 12/09/2022.  She has been in a fair amount of pain since surgery.  She is taking oxycodone and Robaxin.  We initially sent in Eliquis, Xarelto, Lovenox to take one of the 3 postop for DVT prophylaxis due to her underlying A-fib, however she did not pick up any of these due to financial constraints.  She has been taking a baby aspirin twice daily for DVT prophylaxis.  She denies any calf pain.  She is currently ambulating with a walker.  Examination of her left hip reveals a well-healing surgical incision with nylon sutures in place.  No evidence of infection or cellulitis.  Calves are soft nontender.  She does have moderate swelling of the left lower extremity.  Today, we removed the stitches and applied Steri-Strips.  She will continue with baby aspirin twice daily for another 4 weeks.  I have provided her with a hardcopy PT prescription for location she would like to attend in [place].  I have refilled her oxycodone and Robaxin.  I recommended that she start wearing her compression socks.  She will follow-up with [place] in 4 weeks for repeat evaluation.  Postoperative instructions provided.  Follow-Up Instructions: Return in about 4 weeks (around 01/21/2023).   Orders:  No orders of the defined types were placed in this encounter.  Meds ordered this encounter  Medications   oxyCODONE-acetaminophen (PERCOCET) 5-325 MG tablet    Sig: Take 1-2 tablets by mouth every 8 (eight) hours as needed.    Dispense:  40 tablet    Refill:  0    methocarbamol (ROBAXIN-750) 750 MG tablet    Sig: Take 1 tablet (750 mg total) by mouth 3 (three) times daily as needed for muscle spasms.    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  Past Surgical History:  Procedure Laterality Date   ABDOMINAL HYSTERECTOMY     INCISION AND DRAINAGE BREAST ABSCESS Left    TONSILLECTOMY     removed at 77 yo   TOTAL HIP ARTHROPLASTY Left 12/09/2022   Procedure: TOTAL HIP ARTHROPLASTY ANTERIOR APPROACH;  Surgeon: Tarry Kos, MD;  Location: [facility] OR;  Service: Orthopedics;  Laterality: Left;  3-C   VAGINAL DELIVERY     x2   Social  History   Occupational History   Not on file  Tobacco Use   Smoking status: Light Smoker   Smokeless tobacco: Never  Vaping Use   Vaping status: Never Used  Substance and Sexual Activity   Alcohol use: Yes    Alcohol/week: 7.0 standard drinks of alcohol    Types: 7 Glasses of wine per week   Drug use: Never   Sexual activity: Not on file

## 2023-01-21 ENCOUNTER — Encounter: Payer: Self-pay | Admitting: Orthopaedic Surgery

## 2023-01-21 ENCOUNTER — Ambulatory Visit (INDEPENDENT_AMBULATORY_CARE_PROVIDER_SITE_OTHER): Payer: Medicare Other | Admitting: Orthopaedic Surgery

## 2023-01-21 ENCOUNTER — Other Ambulatory Visit (INDEPENDENT_AMBULATORY_CARE_PROVIDER_SITE_OTHER): Payer: Self-pay

## 2023-01-21 DIAGNOSIS — Z96642 Presence of left artificial hip joint: Secondary | ICD-10-CM

## 2023-01-21 NOTE — Progress Notes (Signed)
   Post-Op Visit Note   Patient: Angel Mason           Date of Birth: 1945-04-18           MRN: [id number] Visit Date: 01/21/2023 PCP: Isabella Bowens, MD   Assessment & Plan:  Chief Complaint:  Chief Complaint  Patient presents with   Left Hip - Follow-up    Left total hip arthroplasty 12/09/2022   Visit Diagnoses:  1. Status post total replacement of left hip     Plan: [name] is is here for 6-week postop check for left total hip replacement.  Overall she feels like she is getting better but still some soreness.  Occasionally she will take some Percocet for breakthrough pain.  Exam shows that she is using a cane for ambulation in public.  She has fluid painless range of motion of the hip.  Fully healed surgical scar.  No signs of drainage or infection.  X-rays that show stable implant.  Dental prophylaxis reinforced.  Activity as tolerated.  Recheck in 6 weeks.  Follow-Up Instructions: Return in about 6 weeks (around 03/04/2023) for with lindsey.   Orders:  Orders Placed This Encounter  Procedures   XR Pelvis 1-2 Views   No orders of the defined types were placed in this encounter.   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  Past Surgical History:  Procedure Laterality Date   ABDOMINAL HYSTERECTOMY     INCISION AND  DRAINAGE BREAST ABSCESS Left    TONSILLECTOMY     removed at 77 yo   TOTAL HIP ARTHROPLASTY Left 12/09/2022   Procedure: TOTAL HIP ARTHROPLASTY ANTERIOR APPROACH;  Surgeon: Tarry Kos, MD;  Location: [facility] OR;  Service: Orthopedics;  Laterality: Left;  3-C   VAGINAL DELIVERY     x2   Social History   Occupational History   Not on file  Tobacco Use   Smoking status: Light Smoker   Smokeless tobacco: Never  Vaping Use   Vaping status: Never Used  Substance and Sexual Activity   Alcohol use: Yes    Alcohol/week: 7.0 standard drinks of alcohol    Types: 7 Glasses of wine per week   Drug use: Never   Sexual activity: Not on file

## 2023-03-04 ENCOUNTER — Ambulatory Visit (INDEPENDENT_AMBULATORY_CARE_PROVIDER_SITE_OTHER): Payer: Medicare Other | Admitting: Physician Assistant

## 2023-03-04 ENCOUNTER — Encounter: Payer: Self-pay | Admitting: Physician Assistant

## 2023-03-04 DIAGNOSIS — Z96642 Presence of left artificial hip joint: Secondary | ICD-10-CM

## 2023-03-04 NOTE — Progress Notes (Signed)
   Post-Op Visit Note   Patient: Angel Mason           Date of Birth: 1945/03/30           MRN: [id number] Visit Date: 03/04/2023 PCP: Isabella Bowens, MD   Assessment & Plan:  Chief Complaint:  Chief Complaint  Patient presents with   Left Hip - Follow-up    Left total hip arthroplasty 12/09/2022   Visit Diagnoses:  1. Status post total replacement of left hip     Plan: Patient is a pleasant 78 year old female who comes in today 3 months status post left total hip replacement 12/09/2022.  She has still complaining of discomfort with hip flexion and trouble with stair climbing, although she does note that both of these have improved.  She takes an occasional Aleve.  She is ambulating with a cane today.  Examination of the left hip reveals mild discomfort with hip flexion and logroll.  She does have some difficulty with hip flexion.  She is neurovascularly intact distally.  At this point, well for her formal physical therapy.  She has politely declined and would like to work on a home exercise program instead.  She will follow-up with [place] in 3 months for repeat evaluation and AP pelvis x-rays.  Call with concerns or questions.  Follow-Up Instructions: Return in about 3 months (around 06/02/2023).   Orders:  No orders of the defined types were placed in this encounter.  No orders of the defined types were placed in this encounter.   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  Past Surgical History:  Procedure Laterality Date   ABDOMINAL HYSTERECTOMY     INCISION AND DRAINAGE BREAST ABSCESS Left    TONSILLECTOMY     removed at 78 yo   TOTAL HIP ARTHROPLASTY Left 12/09/2022   Procedure: TOTAL HIP ARTHROPLASTY ANTERIOR APPROACH;  Surgeon: Tarry Kos, MD;  Location: [facility] OR;  Service: Orthopedics;  Laterality: Left;  3-C   VAGINAL DELIVERY     x2   Social History   Occupational History   Not on file  Tobacco Use   Smoking status: Light Smoker   Smokeless tobacco: Never  Vaping Use   Vaping status: Never Used  Substance and Sexual Activity   Alcohol use: Yes    Alcohol/week: 7.0 standard drinks of alcohol    Types: 7 Glasses of wine per week   Drug use: Never   Sexual activity: Not on file

## 2023-06-02 ENCOUNTER — Other Ambulatory Visit (INDEPENDENT_AMBULATORY_CARE_PROVIDER_SITE_OTHER): Payer: Self-pay

## 2023-06-02 ENCOUNTER — Ambulatory Visit: Payer: Medicare Other | Admitting: Physician Assistant

## 2023-06-02 DIAGNOSIS — Z96642 Presence of left artificial hip joint: Secondary | ICD-10-CM

## 2023-06-02 NOTE — Progress Notes (Signed)
   Post-Op Visit Note   Patient: Angel Mason           Date of Birth: 1945/02/22           MRN: [id number] Visit Date: 06/02/2023 PCP: Marcellus Sers, MD   Assessment & Plan:  Chief Complaint:  Chief Complaint  Patient presents with   Left Hip - Pain   Visit Diagnoses:  1. Status post total replacement of left hip     Plan: Patient is a pleasant 78 year old female who comes in today 6 months status post left total hip replacement 12/09/2022.  She does note some occasional hip discomfort when rolling over in the bed.  Otherwise, no complaints and doing great.  She is back to gardening which she is enjoying.  Examination of the left hip reveals painless hip flexion and logroll.  She is neurovascularly intact distally.  At this point, she will continue to advance with activity as tolerated.  Follow-up in 6 months for repeat evaluation and AP pelvis x-rays.  Call with concerns or questions.  Follow-Up Instructions: Return in about 6 months (around 12/02/2023).   Orders:  Orders Placed This Encounter  Procedures   XR Pelvis 1-2 Views   No orders of the defined types were placed in this encounter.   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  Past Surgical History:  Procedure Laterality Date   ABDOMINAL HYSTERECTOMY     INCISION AND DRAINAGE  BREAST ABSCESS Left    TONSILLECTOMY     removed at 78 yo   TOTAL HIP ARTHROPLASTY Left 12/09/2022   Procedure: TOTAL HIP ARTHROPLASTY ANTERIOR APPROACH;  Surgeon: Wes Hamman, MD;  Location: [facility] OR;  Service: Orthopedics;  Laterality: Left;  3-C   VAGINAL DELIVERY     x2   Social History   Occupational History   Not on file  Tobacco Use   Smoking status: Light Smoker   Smokeless tobacco: Never  Vaping Use   Vaping status: Never Used  Substance and Sexual Activity   Alcohol use: Yes    Alcohol/week: 7.0 standard drinks of alcohol    Types: 7 Glasses of wine per week   Drug use: Never   Sexual activity: Not on file

## 2023-12-02 ENCOUNTER — Ambulatory Visit: Admitting: Physician Assistant

## 2023-12-07 ENCOUNTER — Ambulatory Visit: Admitting: Physician Assistant

## 2023-12-07 ENCOUNTER — Other Ambulatory Visit: Payer: Self-pay

## 2023-12-07 DIAGNOSIS — Z96642 Presence of left artificial hip joint: Secondary | ICD-10-CM

## 2023-12-07 NOTE — Progress Notes (Signed)
   Post-Op Visit Note   Patient: Angel Mason           Date of Birth: 1945-07-24           MRN: [id number] Visit Date: 12/07/2023 PCP: Luetta Edie Bi, MD   Assessment & Plan:  Chief Complaint:  Chief Complaint  Patient presents with   Left Hip - Pain   Visit Diagnoses:  1. Status post total replacement of left hip     Plan: Patient is a pleasant 78 year old female who comes in today 1 year status post left total hip replacement.  She notes occasional burning and tenderness to the hip but nothing more.  Overall she is feeling fantastic.  Examination of her left hip reveals mild discomfort with hip flexion and logroll.  She is neurovascularly intact distally.  At this point, she will continue to advance with activity as tolerated.  Follow-up in 1 year for repeat evaluation and AP pelvis x-rays.  Call with concerns or questions.  Follow-Up Instructions: Return in about 1 year (around 12/06/2024).   Orders:  Orders Placed This Encounter  Procedures   XR HIP UNILAT W OR W/O PELVIS 2-3 VIEWS LEFT   No orders of the defined types were placed in this encounter.   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  Past Surgical History:  Procedure Laterality Date   ABDOMINAL HYSTERECTOMY     INCISION AND DRAINAGE BREAST ABSCESS Left     TONSILLECTOMY     removed at 78 yo   TOTAL HIP ARTHROPLASTY Left 12/09/2022   Procedure: TOTAL HIP ARTHROPLASTY ANTERIOR APPROACH;  Surgeon: Jerri Kay HERO, MD;  Location: [facility] OR;  Service: Orthopedics;  Laterality: Left;  3-C   VAGINAL DELIVERY     x2   Social History   Occupational History   Not on file  Tobacco Use   Smoking status: Light Smoker   Smokeless tobacco: Never  Vaping Use   Vaping status: Never Used  Substance and Sexual Activity   Alcohol use: Yes    Alcohol/week: 7.0 standard drinks of alcohol    Types: 7 Glasses of wine per week   Drug use: Never   Sexual activity: Not on file

## 2023-12-12 ENCOUNTER — Encounter: Payer: Self-pay | Admitting: Radiology
